# Patient Record
Sex: Female | Born: 1965 | Race: White | Hispanic: No | Marital: Single | Smoking: Current every day smoker
Health system: Southern US, Community
[De-identification: ages and names within clinical notes are randomized; demographics above are authoritative.]

## PROBLEM LIST (undated history)

## (undated) DIAGNOSIS — E785 Hyperlipidemia, unspecified: Secondary | ICD-10-CM

## (undated) DIAGNOSIS — E119 Type 2 diabetes mellitus without complications: Secondary | ICD-10-CM

## (undated) DIAGNOSIS — I10 Essential (primary) hypertension: Secondary | ICD-10-CM

## (undated) DIAGNOSIS — J449 Chronic obstructive pulmonary disease, unspecified: Secondary | ICD-10-CM

## (undated) HISTORY — PX: CHOLECYSTECTOMY: SHX55

## (undated) HISTORY — PX: APPENDECTOMY: SHX54

## (undated) HISTORY — PX: HAND SURGERY: SHX662

## (undated) HISTORY — PX: NECK SURGERY: SHX720

## (undated) HISTORY — PX: FOOT SURGERY: SHX648

## (undated) HISTORY — PX: TONSILLECTOMY: SUR1361

## (undated) HISTORY — PX: KNEE SURGERY: SHX244

## (undated) HISTORY — PX: INGUINAL HERNIA REPAIR: SHX194

---

## 2012-11-08 ENCOUNTER — Emergency Department: Payer: Self-pay | Admitting: Emergency Medicine

## 2013-01-30 ENCOUNTER — Emergency Department: Payer: Self-pay | Admitting: Emergency Medicine

## 2013-01-30 LAB — URINALYSIS, COMPLETE
Bilirubin,UR: NEGATIVE
Blood: NEGATIVE
Glucose,UR: NEGATIVE mg/dL (ref 0–75)
Ketone: NEGATIVE
Nitrite: NEGATIVE
Ph: 5 (ref 4.5–8.0)
Specific Gravity: 1.019 (ref 1.003–1.030)
Squamous Epithelial: 30
WBC UR: 1 /HPF (ref 0–5)

## 2013-01-30 LAB — COMPREHENSIVE METABOLIC PANEL
Albumin: 3.9 g/dL (ref 3.4–5.0)
Alkaline Phosphatase: 93 U/L (ref 50–136)
Anion Gap: 8 (ref 7–16)
Calcium, Total: 8.8 mg/dL (ref 8.5–10.1)
Chloride: 106 mmol/L (ref 98–107)
Co2: 27 mmol/L (ref 21–32)
Creatinine: 0.81 mg/dL (ref 0.60–1.30)
EGFR (African American): >60
EGFR (Non-African Amer.): >60
Glucose: 97 mg/dL (ref 65–99)
Osmolality: 280 (ref 275–301)
SGOT(AST): 33 U/L (ref 15–37)

## 2013-01-30 LAB — CBC
HCT: 43.5 % (ref 35.0–47.0)
HGB: 14.8 g/dL (ref 12.0–16.0)
MCH: 31.4 pg (ref 26.0–34.0)
RBC: 4.71 10*6/uL (ref 3.80–5.20)
RDW: 12.8 % (ref 11.5–14.5)

## 2013-01-30 LAB — RAPID INFLUENZA A&B ANTIGENS

## 2013-02-12 ENCOUNTER — Emergency Department: Payer: Self-pay | Admitting: Emergency Medicine

## 2013-03-23 ENCOUNTER — Ambulatory Visit: Payer: Self-pay | Admitting: Family Medicine

## 2013-03-29 ENCOUNTER — Emergency Department: Payer: Self-pay

## 2013-03-29 LAB — COMPREHENSIVE METABOLIC PANEL WITH GFR
Albumin: 3.9 g/dL
Alkaline Phosphatase: 94 U/L
Anion Gap: 5 — ABNORMAL LOW
BUN: 16 mg/dL
Bilirubin,Total: 0.3 mg/dL
Calcium, Total: 8.8 mg/dL
Chloride: 110 mmol/L — ABNORMAL HIGH
Co2: 26 mmol/L
Creatinine: 0.68 mg/dL
EGFR (African American): >60
EGFR (Non-African Amer.): >60
Glucose: 115 mg/dL — ABNORMAL HIGH
Osmolality: 283
Potassium: 3.7 mmol/L
SGOT(AST): 26 U/L
SGPT (ALT): 30 U/L
Sodium: 141 mmol/L
Total Protein: 7.3 g/dL

## 2013-03-29 LAB — CBC
HCT: 40.9 % (ref 35.0–47.0)
MCH: 31.3 pg (ref 26.0–34.0)
MCV: 91 fL (ref 80–100)
Platelet: 276 10*3/uL (ref 150–440)
RBC: 4.51 10*6/uL (ref 3.80–5.20)
RDW: 13.4 % (ref 11.5–14.5)
WBC: 7.4 10*3/uL (ref 3.6–11.0)

## 2013-03-29 LAB — URINALYSIS, COMPLETE
Bacteria: NONE SEEN
Bilirubin,UR: NEGATIVE
Blood: NEGATIVE
Glucose,UR: NEGATIVE mg/dL
Ketone: NEGATIVE
Leukocyte Esterase: NEGATIVE
Nitrite: NEGATIVE
Ph: 5
Protein: NEGATIVE
RBC,UR: 1 /HPF
Specific Gravity: 1.02
Squamous Epithelial: 2
WBC UR: 2 /HPF

## 2013-03-29 LAB — CK TOTAL AND CKMB (NOT AT ARMC): CK-MB: 0.9 ng/mL (ref 0.5–3.6)

## 2013-03-29 LAB — TROPONIN I: Troponin-I: <0.02 ng/mL

## 2014-09-19 ENCOUNTER — Emergency Department (HOSPITAL_COMMUNITY)
Admission: EM | Admit: 2014-09-19 | Discharge: 2014-09-19 | Disposition: A | Discharge status: Home / Self Care | Payer: Medicaid Other | Attending: Emergency Medicine | Admitting: Emergency Medicine

## 2014-09-19 ENCOUNTER — Emergency Department (HOSPITAL_COMMUNITY): Payer: Medicaid Other

## 2014-09-19 ENCOUNTER — Encounter (HOSPITAL_COMMUNITY): Payer: Self-pay | Admitting: Emergency Medicine

## 2014-09-19 DIAGNOSIS — Z720 Tobacco use: Secondary | ICD-10-CM | POA: Insufficient documentation

## 2014-09-19 DIAGNOSIS — Y9389 Activity, other specified: Secondary | ICD-10-CM | POA: Insufficient documentation

## 2014-09-19 DIAGNOSIS — W1830XA Fall on same level, unspecified, initial encounter: Secondary | ICD-10-CM | POA: Diagnosis not present

## 2014-09-19 DIAGNOSIS — Y9289 Other specified places as the place of occurrence of the external cause: Secondary | ICD-10-CM | POA: Insufficient documentation

## 2014-09-19 DIAGNOSIS — E119 Type 2 diabetes mellitus without complications: Secondary | ICD-10-CM | POA: Insufficient documentation

## 2014-09-19 DIAGNOSIS — I10 Essential (primary) hypertension: Secondary | ICD-10-CM | POA: Insufficient documentation

## 2014-09-19 DIAGNOSIS — M5442 Lumbago with sciatica, left side: Secondary | ICD-10-CM

## 2014-09-19 DIAGNOSIS — S300XXA Contusion of lower back and pelvis, initial encounter: Secondary | ICD-10-CM | POA: Insufficient documentation

## 2014-09-19 DIAGNOSIS — S3992XA Unspecified injury of lower back, initial encounter: Secondary | ICD-10-CM | POA: Insufficient documentation

## 2014-09-19 DIAGNOSIS — J449 Chronic obstructive pulmonary disease, unspecified: Secondary | ICD-10-CM | POA: Insufficient documentation

## 2014-09-19 HISTORY — DX: Chronic obstructive pulmonary disease, unspecified: J44.9

## 2014-09-19 HISTORY — DX: Hyperlipidemia, unspecified: E78.5

## 2014-09-19 HISTORY — DX: Essential (primary) hypertension: I10

## 2014-09-19 HISTORY — DX: Type 2 diabetes mellitus without complications: E11.9

## 2014-09-19 MED ORDER — OXYCODONE-ACETAMINOPHEN 5-325 MG PO TABS: 1.0000 | ORAL_TABLET | Freq: Four times a day (QID) | ORAL | Status: DC | PRN

## 2014-09-19 MED ORDER — OXYCODONE-ACETAMINOPHEN 5-325 MG PO TABS
2.0000 | ORAL_TABLET | Freq: Once | ORAL | Status: AC
  Administered 2014-09-19: 2 via ORAL
  Filled 2014-09-19: qty 2

## 2014-09-19 MED ORDER — ONDANSETRON 4 MG PO TBDP
4.0000 mg | ORAL_TABLET | Freq: Once | ORAL | Status: AC
  Administered 2014-09-19: 4 mg via ORAL
  Filled 2014-09-19: qty 1

## 2014-09-19 MED ORDER — HYDROCODONE-ACETAMINOPHEN 5-325 MG PO TABS
2.0000 | ORAL_TABLET | Freq: Once | ORAL | Status: DC
  Filled 2014-09-19: qty 2

## 2014-09-19 MED ORDER — CYCLOBENZAPRINE HCL 10 MG PO TABS: 10.0000 mg | ORAL_TABLET | Freq: Two times a day (BID) | ORAL | Status: DC | PRN

## 2014-09-19 NOTE — Discharge Instructions (Signed)
Sciatica °Sciatica is pain, weakness, numbness, or tingling along the path of the sciatic nerve. The nerve starts in the lower back and runs down the back of each leg. The nerve controls the muscles in the lower leg and in the back of the knee, while also providing sensation to the back of the thigh, lower leg, and the sole of your foot. Sciatica is a symptom of another medical condition. For instance, nerve damage or certain conditions, such as a herniated disk or bone spur on the spine, pinch or put pressure on the sciatic nerve. This causes the pain, weakness, or other sensations normally associated with sciatica. Generally, sciatica only affects one side of the body. °CAUSES  °· Herniated or slipped disc. °· Degenerative disk disease. °· A pain disorder involving the narrow muscle in the buttocks (piriformis syndrome). °· Pelvic injury or fracture. °· Pregnancy. °· Tumor (rare). °SYMPTOMS  °Symptoms can vary from mild to very severe. The symptoms usually travel from the low back to the buttocks and down the back of the leg. Symptoms can include: °· Mild tingling or dull aches in the lower back, leg, or hip. °· Numbness in the back of the calf or sole of the foot. °· Burning sensations in the lower back, leg, or hip. °· Sharp pains in the lower back, leg, or hip. °· Leg weakness. °· Severe back pain inhibiting movement. °These symptoms may get worse with coughing, sneezing, laughing, or prolonged sitting or standing. Also, being overweight may worsen symptoms. °DIAGNOSIS  °Your caregiver will perform a physical exam to look for common symptoms of sciatica. He or she may ask you to do certain movements or activities that would trigger sciatic nerve pain. Other tests may be performed to find the cause of the sciatica. These may include: °· Blood tests. °· X-rays. °· Imaging tests, such as an MRI or CT scan. °TREATMENT  °Treatment is directed at the cause of the sciatic pain. Sometimes, treatment is not necessary  and the pain and discomfort goes away on its own. If treatment is needed, your caregiver may suggest: °· Over-the-counter medicines to relieve pain. °· Prescription medicines, such as anti-inflammatory medicine, muscle relaxants, or narcotics. °· Applying heat or ice to the painful area. °· Steroid injections to lessen pain, irritation, and inflammation around the nerve. °· Reducing activity during periods of pain. °· Exercising and stretching to strengthen your abdomen and improve flexibility of your spine. Your caregiver may suggest losing weight if the extra weight makes the back pain worse. °· Physical therapy. °· Surgery to eliminate what is pressing or pinching the nerve, such as a bone spur or part of a herniated disk. °HOME CARE INSTRUCTIONS  °· Only take over-the-counter or prescription medicines for pain or discomfort as directed by your caregiver. °· Apply ice to the affected area for 20 minutes, 3-4 times a day for the first 48-72 hours. Then try heat in the same way. °· Exercise, stretch, or perform your usual activities if these do not aggravate your pain. °· Attend physical therapy sessions as directed by your caregiver. °· Keep all follow-up appointments as directed by your caregiver. °· Do not wear high heels or shoes that do not provide proper support. °· Check your mattress to see if it is too soft. A firm mattress may lessen your pain and discomfort. °SEEK IMMEDIATE MEDICAL CARE IF:  °· You lose control of your bowel or bladder (incontinence). °· You have increasing weakness in the lower back, pelvis, buttocks,   or legs. °· You have redness or swelling of your back. °· You have a burning sensation when you urinate. °· You have pain that gets worse when you lie down or awakens you at night. °· Your pain is worse than you have experienced in the past. °· Your pain is lasting longer than 4 weeks. °· You are suddenly losing weight without reason. °MAKE SURE YOU: °· Understand these  instructions. °· Will watch your condition. °· Will get help right away if you are not doing well or get worse. °Document Released: 11/24/2001 Document Revised: 05/31/2012 Document Reviewed: 04/10/2012 °[employer]® Patient Information ©2015 [employer]. This information is not intended to replace advice given to you by your health care provider. Make sure you discuss any questions you have with your health care provider. ° °Back Pain, Adult °Back pain is very common. The pain often gets better over time. The cause of back pain is usually not dangerous. Most people can learn to manage their back pain on their own.  °HOME CARE  °· Stay active. Start with short walks on flat ground if you can. Try to walk farther each day. °· Do not sit, drive, or stand in one place for more than 30 minutes. Do not stay in bed. °· Do not avoid exercise or work. Activity can help your back heal faster. °· Be careful when you bend or lift an object. Bend at your knees, keep the object close to you, and do not twist. °· Sleep on a firm mattress. Lie on your side, and bend your knees. If you lie on your back, put a pillow under your knees. °· Only take medicines as told by your doctor. °· Put ice on the injured area. °¨ Put ice in a plastic bag. °¨ Place a towel between your skin and the bag. °¨ Leave the ice on for 15-20 minutes, 03-04 times a day for the first 2 to 3 days. After that, you can switch between ice and heat packs. °· Ask your doctor about back exercises or massage. °· Avoid feeling anxious or stressed. Find good ways to deal with stress, such as exercise. °GET HELP RIGHT AWAY IF:  °· Your pain does not go away with rest or medicine. °· Your pain does not go away in 1 week. °· You have new problems. °· You do not feel well. °· The pain spreads into your legs. °· You cannot control when you poop (bowel movement) or pee (urinate). °· Your arms or legs feel weak or lose feeling (numbness). °· You feel sick to your stomach  (nauseous) or throw up (vomit). °· You have belly (abdominal) pain. °· You feel like you may pass out (faint). °MAKE SURE YOU:  °· Understand these instructions. °· Will watch your condition. °· Will get help right away if you are not doing well or get worse. °Document Released: 05/18/2008 Document Revised: 02/22/2012 Document Reviewed: 04/03/2014 °[employer]® Patient Information ©2015 [employer]. This information is not intended to replace advice given to you by your health care provider. Make sure you discuss any questions you have with your health care provider. ° °Back Exercises °Back exercises help treat and prevent back injuries. The goal is to increase your strength in your belly (abdominal) and back muscles. These exercises can also help with flexibility. Start these exercises when told by your doctor. °HOME CARE °Back exercises include: °Pelvic Tilt. °· Lie on your back with your knees bent. Tilt your pelvis until the lower part of   your back is against the floor. Hold this position 5 to 10 sec. Repeat this exercise 5 to 10 times. °Knee to Chest. °· Pull 1 knee up against your chest and hold for 20 to 30 seconds. Repeat this with the other knee. This may be done with the other leg straight or bent, whichever feels better. Then, pull both knees up against your chest. °Sit-Ups or Curl-Ups. °· Bend your knees 90 degrees. Start with tilting your pelvis, and do a partial, slow sit-up. Only lift your upper half 30 to 45 degrees off the floor. Take at least 2 to 3 seonds for each sit-up. Do not do sit-ups with your knees out straight. If partial sit-ups are difficult, simply do the above but with only tightening your belly (abdominal) muscles and holding it as told. °Hip-Lift. °· Lie on your back with your knees flexed 90 degrees. Push down with your feet and shoulders as you raise your hips 2 inches off the floor. Hold for 10 seconds, repeat 5 to 10 times. °Back Arches. °· Lie on your stomach. Prop yourself up on  bent elbows. Slowly press on your hands, causing an arch in your low back. Repeat 3 to 5 times. °Shoulder-Lifts. °· Lie face down with arms beside your body. Keep hips and belly pressed to floor as you slowly lift your head and shoulders off the floor. °Do not overdo your exercises. Be careful in the beginning. Exercises may cause you some mild back discomfort. If the pain lasts for more than 15 minutes, stop the exercises until you see your doctor. Improvement with exercise for back problems is slow.  °Document Released: 01/02/2011 Document Revised: 02/22/2012 Document Reviewed: 10/01/2011 °ExitCare® Patient Information ©2015 [employer]. This information is not intended to replace advice given to you by your health care provider. Make sure you discuss any questions you have with your health care provider. ° °

## 2014-09-19 NOTE — ED Notes (Signed)
Pt reports she fell back into a heater 2 weeks ago injuring L side of back. Pt slept on air mattress last night and reports increased pain.no loss of bowel or urine.

## 2014-09-19 NOTE — ED Provider Notes (Signed)
CSN: [id number]     Arrival date & time 09/19/14  4098 History  This chart was scribed for non-physician practitioner, Ladona Mow, PA-C working with Toy Cookey, MD by Greggory Stallion, ED scribe. This patient was seen in room TR07C/TR07C and the patient's care was started at 7:58 PM.   Chief Complaint  Patient presents with  . Back Pain   The history is provided by the patient. No language interpreter was used.   HPI Comments: Connie Hernandez is a 48 y.o. female who presents to the Emergency Department complaining of left lower back pain that started 2 weeks ago after falling back onto a heater. States pain worsened last night while sleeping. Pain radiates into her left buttock, groin and thigh. Movements worsen the pain. She has used icyhot with no relief. Denies fever, bowel or bladder incontinence, saddle anesthesia. Denies personal history of cancer or IV drug use.   Past Medical History  Diagnosis Date  . COPD (chronic obstructive pulmonary disease)   . Hypertension   . Hyperlipemia   . Diabetes mellitus without complication    Past Surgical History  Procedure Laterality Date  . Neck surgery    . Hand surgery    . Cholecystectomy    . Appendectomy    . Tonsillectomy    . Inguinal hernia repair    . Foot surgery    . Knee surgery     No family history on file. History  Substance Use Topics  . Smoking status: Current Every Day Smoker  . Smokeless tobacco: Not on file  . Alcohol Use: No   OB History   Grav Para Term Preterm Abortions TAB SAB Ect Mult Living                 Review of Systems  Constitutional: Negative for fever.  Genitourinary:       Negative for bowel or bladder incontinence.  Musculoskeletal: Positive for back pain and myalgias.  All other systems reviewed and are negative.  Allergies  Dilaudid; Vicodin; and Wellbutrin  Home Medications   Prior to Admission medications   Medication Sig Start Date End Date Taking? Authorizing Provider   cyclobenzaprine (FLEXERIL) 10 MG tablet Take 1 tablet (10 mg total) by mouth 2 (two) times daily as needed for muscle spasms. 09/19/14   Monte Fantasia, PA-C  oxyCODONE-acetaminophen (PERCOCET) 5-325 MG per tablet Take 1 tablet by mouth every 6 (six) hours as needed. 09/19/14   Monte Fantasia, PA-C   BP 123/78  Pulse 73  Temp(Src) 98.3 F (36.8 C) (Oral)  Resp 18  Ht 5\' 1"  (1.549 m)  Wt 205 lb (92.987 kg)  BMI 38.75 kg/m2  SpO2 97%  Physical Exam  Nursing note and vitals reviewed. Constitutional: She is oriented to person, place, and time. She appears well-developed and well-nourished. No distress.  HENT:  Head: Normocephalic and atraumatic.  Eyes: Conjunctivae and EOM are normal.  Neck: Neck supple. No tracheal deviation present.  Cardiovascular: Normal rate.   Pulmonary/Chest: Effort normal. No respiratory distress.  Musculoskeletal: Normal range of motion.  Contusion on left upper gluteal region. Tenderness diffusely throughout L-spine and left SI joint. Positive straight leg raise test on the left.   Neurological: She is alert and oriented to person, place, and time.  Skin: Skin is warm and dry.  Psychiatric: She has a normal mood and affect. Her behavior is normal.    ED Course  Procedures (including critical care time)  DIAGNOSTIC STUDIES: Oxygen Saturation is  98% on RA, normal by my interpretation.    COORDINATION OF CARE: 8:05 PM-Discussed treatment plan which includes xray, pain medication, an anti-inflammatory and a muscle relaxer with pt at bedside and pt agreed to plan.   Labs Review Labs Reviewed - No data to display  Imaging Review Dg Lumbar Spine Complete  09/19/2014   CLINICAL DATA:  Initial encounter for fall 2 weeks at home. The patient struck and cast iron heater with her left buttock and hit the floor with her back. Progressive pain. Pain extends from the left buttock into the left upper lumbar region. Pain also extends into the left groin.  EXAM: LUMBAR  SPINE - COMPLETE 4+ VIEW  COMPARISON:  None.  FINDINGS: Only 4 non rib-bearing lumbar type vertebral bodies are present. Vertebral body heights and alignment are maintained. No acute abnormality is present. Minimal vascular calcifications are noted at the aortic bifurcation. Surgical clips are present within the gallbladder fossa.  IMPRESSION: 1. No acute abnormality. 2. 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 3. Minimal atherosclerosis.   Electronically Signed   By: Gennette Pachris  Mattern M.D.   On: 09/19/2014 22:07     EKG Interpretation None      MDM   Final diagnoses:  Midline low back pain with left-sided sciatica   Patient with back pain.  No neurological deficits and normal neuro exam.  Patient can walk but states is painful.  No loss of bowel or bladder control.  No concern for cauda equina.  No fever, night sweats, weight loss, h/o cancer, IVDU.  RICE protocol and pain medicine indicated and discussed with patient. Patient's [place] and she will followup with them when she returns back home. I encouraged patient to call or return to the ER should her symptoms persist, change, worsen or should she have a questions or concerns  BP 123/78  Pulse 73  Temp(Src) 98.3 F (36.8 C) (Oral)  Resp 18  Ht 5\' 1"  (1.549 m)  Wt 205 lb (92.987 kg)  BMI 38.75 kg/m2  SpO2 97%  Signed,  Ladona MowJoe Mackenna Kamer, PA-C 2:24 AM  I personally performed the services described in this documentation, which was scribed in my presence. The recorded information has been reviewed and is accurate.  Monte FantasiaJoseph W Skylan Gift, PA-C 09/20/14 [phone]

## 2014-09-20 NOTE — ED Provider Notes (Signed)
Medical screening examination/treatment/procedure(s) were performed by non-physician practitioner and as supervising physician I was immediately available for consultation/collaboration.  Megan Docherty, MD 09/20/14 0926 

## 2019-01-17 ENCOUNTER — Other Ambulatory Visit: Payer: Self-pay | Admitting: Family Medicine

## 2019-01-17 DIAGNOSIS — R928 Other abnormal and inconclusive findings on diagnostic imaging of breast: Secondary | ICD-10-CM

## 2019-02-22 ENCOUNTER — Encounter: Payer: Self-pay | Admitting: Emergency Medicine

## 2019-02-22 ENCOUNTER — Emergency Department: Payer: Medicaid Other

## 2019-02-22 ENCOUNTER — Other Ambulatory Visit: Payer: Self-pay

## 2019-02-22 ENCOUNTER — Emergency Department
Admission: EM | Admit: 2019-02-22 | Discharge: 2019-02-22 | Disposition: A | Discharge status: Home / Self Care | Payer: Medicaid Other | Attending: Emergency Medicine | Admitting: Emergency Medicine

## 2019-02-22 DIAGNOSIS — Z79899 Other long term (current) drug therapy: Secondary | ICD-10-CM | POA: Diagnosis not present

## 2019-02-22 DIAGNOSIS — I10 Essential (primary) hypertension: Secondary | ICD-10-CM | POA: Diagnosis not present

## 2019-02-22 DIAGNOSIS — M65271 Calcific tendinitis, right ankle and foot: Secondary | ICD-10-CM | POA: Insufficient documentation

## 2019-02-22 DIAGNOSIS — M79604 Pain in right leg: Secondary | ICD-10-CM | POA: Diagnosis present

## 2019-02-22 DIAGNOSIS — E119 Type 2 diabetes mellitus without complications: Secondary | ICD-10-CM | POA: Diagnosis not present

## 2019-02-22 DIAGNOSIS — F17210 Nicotine dependence, cigarettes, uncomplicated: Secondary | ICD-10-CM | POA: Insufficient documentation

## 2019-02-22 DIAGNOSIS — J449 Chronic obstructive pulmonary disease, unspecified: Secondary | ICD-10-CM | POA: Insufficient documentation

## 2019-02-22 DIAGNOSIS — M25551 Pain in right hip: Secondary | ICD-10-CM | POA: Insufficient documentation

## 2019-02-22 MED ORDER — MELOXICAM 15 MG PO TABS: 15.0000 mg | ORAL_TABLET | Freq: Every day | ORAL | 0 refills | Status: DC

## 2019-02-22 NOTE — ED Notes (Signed)

## 2019-02-22 NOTE — ED Triage Notes (Signed)
Patient complaining of right leg pain "off and on for a couple of months", worsening recently, keeping her up at night.  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  Patient states pain is in her foot radiating up her leg.

## 2019-02-22 NOTE — ED Provider Notes (Signed)
[facility] Emergency Department Provider Note   ____________________________________________   None    (approximate)  I have reviewed the triage vital signs and the nursing notes.   HISTORY  Chief Complaint Leg Pain    HPI Connie Hernandez is a 53 y.o. female patient complain of right hip pain for couple of months.  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  Patient believes she has a history of necrosis in both hips which she [place].  Patient also complain of right plantar foot pain it radiates up her leg.  Patient state pain increases with nonweightbearing.  Patient has been told that she has diabetic neuropathy.  Patient rates the pain as a 7/10.  Patient described the pain as "achy".  No palliative measure for complaint.         Past Medical History:  Diagnosis Date  . COPD (chronic obstructive pulmonary disease) (HCC)   . Diabetes mellitus without complication (HCC)   . Hyperlipemia   . Hypertension     There are no active problems to display for this patient.   Past Surgical History:  Procedure Laterality Date  . APPENDECTOMY    . CHOLECYSTECTOMY    . FOOT SURGERY    . HAND SURGERY    . INGUINAL HERNIA REPAIR    . KNEE SURGERY    . NECK SURGERY    . TONSILLECTOMY      Prior to Admission medications   Medication Sig Start Date End Date Taking? Authorizing Provider  cyclobenzaprine (FLEXERIL) 10 MG tablet Take 1 tablet (10 mg total) by mouth 2 (two) times daily as needed for muscle spasms. 09/19/14   Ladona Mow, PA-C  meloxicam (MOBIC) 15 MG tablet Take 1 tablet (15 mg total) by mouth daily. 02/22/19   Joni Reining, PA-C  oxyCODONE-acetaminophen (PERCOCET) 5-325 MG per tablet Take 1 tablet by mouth every 6 (six) hours as needed. 09/19/14   Ladona Mow, PA-C    Allergies Dilaudid [hydromorphone hcl]; Vicodin [hydrocodone-acetaminophen]; and Wellbutrin [bupropion]  No family history on  file.  Social History Social History   Tobacco Use  . Smoking status: Current Every Day Smoker    Packs/day: 0.50    Types: Cigarettes  . Smokeless tobacco: Never Used  Substance Use Topics  . Alcohol use: No  . Drug use: No    Review of Systems Constitutional: No fever/chills Eyes: No visual changes. ENT: No sore throat. Cardiovascular: Denies chest pain. Respiratory: Denies shortness of breath. Gastrointestinal: No abdominal pain.  No nausea, no vomiting.  No diarrhea.  No constipation. Genitourinary: Negative for dysuria. Musculoskeletal: Negative for back pain. Skin: Negative for rash. Neurological: Negative for headaches, focal weakness or numbness. Endocrine:  Diabetes, hyperlipidemia, hypertension. Allergic/Immunilogical: Dilaudid, Vicodin, and Wellbutrin. ____________________________________________   PHYSICAL EXAM:  VITAL SIGNS: ED Triage Vitals  Enc Vitals Group     BP 02/22/19 0954 (!) 156/94     Pulse Rate 02/22/19 0954 93     Resp 02/22/19 0954 16     Temp 02/22/19 0954 98.1 F (36.7 C)     Temp Source 02/22/19 0954 Oral     SpO2 02/22/19 0954 96 %     Weight 02/22/19 0949 172 lb (78 kg)     Height 02/22/19 0949  (1.549 m)     Head Circumference --      Peak Flow --      Pain Score 02/22/19 0949 7  Pain Loc --      Pain Edu? --      Excl. in GC? --     Constitutional: Alert and oriented. Well appearing and in no acute distress. Cardiovascular: Normal rate, regular rhythm. Grossly normal heart sounds.  Good peripheral circulation.  Elevated blood pressure. Respiratory: Normal respiratory effort.  No retractions. Lungs CTAB. Gastrointestinal: Soft and nontender. No distention. No abdominal bruits. No CVA tenderness. Musculoskeletal: No obvious hip deformity.  No leg length discrepancy.  Patient is moderate guarding palpation of the greater trochanter of the right hip.  Examination of foot shows a high arch.  No other obvious deformity.  No  guarding with palpation.  Full and equal range of motion.   Neurologic:  Normal speech and language. No gross focal neurologic deficits are appreciated. No gait instability. Skin:  Skin is warm, dry and intact. No rash noted. Psychiatric: Mood and affect are normal. Speech and behavior are normal.  ____________________________________________   LABS (all labs ordered are listed, but only abnormal results are displayed)  Labs Reviewed - No data to display ____________________________________________  EKG   ____________________________________________  RADIOLOGY  ED MD interpretation:    Official radiology report(s): Dg Foot 2 Views Right  Result Date: 02/22/2019 CLINICAL DATA:  Right foot pain without injury. EXAM: RIGHT FOOT - 2 VIEW COMPARISON:  None. FINDINGS: No evidence of fracture or dislocation. Mild hallux valgus deformity of the MTP joint of the great toe. Calcification projecting over the arch on the lateral view probably relates to chronic tendon calcification. IMPRESSION: No acute bone or joint finding. Mild hallux valgus deformity of the great toe. Calcification projecting over the arch on the lateral view probably relates to chronic tendon calcification. Electronically Signed   By: Paulina Fusi M.D.   On: 02/22/2019 11:33   Dg Hip Unilat W Or Wo Pelvis 2-3 Views Right  Result Date: 02/22/2019 CLINICAL DATA:  Right hip and foot pain over the last several weeks. EXAM: DG HIP (WITH OR WITHOUT PELVIS) 2-3V RIGHT COMPARISON:  Lumbar radiographs 09/19/2014 FINDINGS: Right hip does not show joint space narrowing. There are small inferior acetabular osteophytes. No sign of avascular necrosis on this side. Sacroiliac joints and symphysis pubis appear normal. There are 3 it changes of the left hip with joint space narrowing, inferior acetabular osteophytes and cystic change in the femoral head and acetabulum. IMPRESSION: No acute right hip finding.  Small inferior acetabular  osteophytes. 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 Electronically Signed   By: Paulina Fusi M.D.   On: 02/22/2019 11:31    ____________________________________________   PROCEDURES  Procedure(s) performed (including Critical Care):  Procedures   ____________________________________________   INITIAL IMPRESSION / ASSESSMENT AND PLAN / ED COURSE  As part of my medical decision making, I reviewed the following data within the electronic MEDICAL RECORD NUMBER  Patient complain of chronic right hip pain in 1 week of right foot pain.  Discussed x-ray findings with patient showing degenerative changes in the hip and a calcified tendon lesion i at midfoot.  Patient given discharge instructions and advised follow orthopedic for definitive evaluation and treatment.         ____________________________________________   FINAL CLINICAL IMPRESSION(S) / ED DIAGNOSES  Final diagnoses:  Pain in joint of right hip  Calcific tendinitis of right foot     ED Discharge Orders         Ordered    meloxicam (MOBIC) 15 MG  tablet  Daily     02/22/19 1147           Note:  This document was prepared using Dragon voice recognition software and may include unintentional dictation errors.    Joni Reining, PA-C 02/22/19 1150    Phineas Semen, MD 02/22/19 1212

## 2019-02-22 NOTE — Discharge Instructions (Signed)
Take medication as directed follow-up orthopedic for definitive evaluation and treatment.

## 2019-02-22 NOTE — ED Notes (Signed)
See triage note: Pt c/o pain that starts in the middle of her right foot and travels up to her hips. Pain has been there for a while but has gotten significantly worse over the past few days.

## 2019-04-20 ENCOUNTER — Other Ambulatory Visit: Payer: Self-pay

## 2019-04-20 ENCOUNTER — Emergency Department: Payer: Medicaid Other

## 2019-04-20 ENCOUNTER — Emergency Department
Admission: EM | Admit: 2019-04-20 | Discharge: 2019-04-20 | Disposition: A | Discharge status: Home / Self Care | Payer: Medicaid Other | Attending: Student in an Organized Health Care Education/Training Program | Admitting: Student in an Organized Health Care Education/Training Program

## 2019-04-20 ENCOUNTER — Encounter: Payer: Self-pay | Admitting: Emergency Medicine

## 2019-04-20 DIAGNOSIS — R05 Cough: Secondary | ICD-10-CM

## 2019-04-20 DIAGNOSIS — J309 Allergic rhinitis, unspecified: Secondary | ICD-10-CM | POA: Insufficient documentation

## 2019-04-20 DIAGNOSIS — R059 Cough, unspecified: Secondary | ICD-10-CM

## 2019-04-20 DIAGNOSIS — Z79899 Other long term (current) drug therapy: Secondary | ICD-10-CM | POA: Insufficient documentation

## 2019-04-20 DIAGNOSIS — J449 Chronic obstructive pulmonary disease, unspecified: Secondary | ICD-10-CM | POA: Insufficient documentation

## 2019-04-20 DIAGNOSIS — E119 Type 2 diabetes mellitus without complications: Secondary | ICD-10-CM | POA: Diagnosis not present

## 2019-04-20 DIAGNOSIS — I10 Essential (primary) hypertension: Secondary | ICD-10-CM | POA: Insufficient documentation

## 2019-04-20 DIAGNOSIS — F17210 Nicotine dependence, cigarettes, uncomplicated: Secondary | ICD-10-CM | POA: Insufficient documentation

## 2019-04-20 LAB — CBC
HCT: 42.3 % (ref 36.0–46.0)
Hemoglobin: 14.3 g/dL (ref 12.0–15.0)
MCH: 30.6 pg (ref 26.0–34.0)
MCHC: 33.8 g/dL (ref 30.0–36.0)
MCV: 90.4 fL (ref 80.0–100.0)
Platelets: 267 10*3/uL (ref 150–400)
RBC: 4.68 MIL/uL (ref 3.87–5.11)
RDW: 12.4 % (ref 11.5–15.5)
WBC: 6.7 10*3/uL (ref 4.0–10.5)
nRBC: 0 % (ref 0.0–0.2)

## 2019-04-20 LAB — BASIC METABOLIC PANEL
Anion gap: 10 (ref 5–15)
BUN: 20 mg/dL (ref 6–20)
CO2: 26 mmol/L (ref 22–32)
Calcium: 9.3 mg/dL (ref 8.9–10.3)
Chloride: 106 mmol/L (ref 98–111)
Creatinine, Ser: 0.71 mg/dL (ref 0.44–1.00)
GFR calc Af Amer: >60 mL/min (ref >60)
GFR calc non Af Amer: >60 mL/min (ref >60)
Glucose, Bld: 113 mg/dL — ABNORMAL HIGH (ref 70–99)
Potassium: 4.1 mmol/L (ref 3.5–5.1)
Sodium: 142 mmol/L (ref 135–145)

## 2019-04-20 LAB — URINALYSIS, COMPLETE (UACMP) WITH MICROSCOPIC
Bilirubin Urine: NEGATIVE
Glucose, UA: NEGATIVE mg/dL
Hgb urine dipstick: NEGATIVE
Ketones, ur: NEGATIVE mg/dL
Leukocytes,Ua: NEGATIVE
Nitrite: NEGATIVE
Protein, ur: NEGATIVE mg/dL
Specific Gravity, Urine: 1.008 (ref 1.005–1.030)
WBC, UA: NONE SEEN WBC/hpf (ref 0–5)
pH: 6 (ref 5.0–8.0)

## 2019-04-20 NOTE — ED Provider Notes (Signed)
[facility] Emergency Department Provider Note ____________________________________________  Time seen: 1332  I have reviewed the triage vital signs and the nursing notes.  HISTORY  Chief Complaint  Hypertension; Hyperglycemia; and Cough  HPI Connie Hernandez is a 53 y.o. female presents with self to the ED for evaluation of intermittent hyperglycemia and nonproductive cough.  Patient has been under the care of her primary care provider for the last month or so for similar symptoms.  She reports been evaluated by her primary care provider, and being treated with Singulair, Claritin, Tessalon Perles, and a 14-day course of doxycycline.  When she called report continued cough and congestion, the PCP suggested that she report to a local drive-through COVID testing center.  Patient was evaluated, was deemed not holdable for COVID testing secondary to her symptoms.  She was placed on a 5-day Levaquin course at that time.  Patient at this point has completed both medications.  She continues report a dry, nonproductive cough, watery eyes, and runny nose.  She denies any interim fevers, chills, sweats patient also denies any cough induced vomiting, chest pain, or shortness of breath.  Patient is recently had her blood pressure medicine changed from lisinopril 10 mg twice daily, to 20 mg daily.  She continues to report elevated blood pressure readings since that medication change in the last week. She has also noted BS readings ranging from 75-160s mg/dl.  She is on dietary control for her prediabetes.  She denies any recent travel, sick contacts, or high risk exposures at this time.  Past Medical History:  Diagnosis Date  . COPD (chronic obstructive pulmonary disease) (HCC)   . Diabetes mellitus without complication (HCC)   . Hyperlipemia   . Hypertension     There are no active problems to display for this patient.   Past Surgical History:  Procedure Laterality Date  .  APPENDECTOMY    . CHOLECYSTECTOMY    . FOOT SURGERY    . HAND SURGERY    . INGUINAL HERNIA REPAIR    . KNEE SURGERY    . NECK SURGERY    . TONSILLECTOMY      Prior to Admission medications   Medication Sig Start Date End Date Taking? Authorizing Provider  cyclobenzaprine (FLEXERIL) 10 MG tablet Take 1 tablet (10 mg total) by mouth 2 (two) times daily as needed for muscle spasms. 09/19/14   Ladona MowMintz, Joe, PA-C  meloxicam (MOBIC) 15 MG tablet Take 1 tablet (15 mg total) by mouth daily. 02/22/19   Joni ReiningSmith, Ronald K, PA-C  oxyCODONE-acetaminophen (PERCOCET) 5-325 MG per tablet Take 1 tablet by mouth every 6 (six) hours as needed. 09/19/14   Ladona MowMintz, Joe, PA-C    Allergies Dilaudid [hydromorphone hcl]; Vicodin [hydrocodone-acetaminophen]; and Wellbutrin [bupropion]  No family history on file.  Social History Social History   Tobacco Use  . Smoking status: Current Every Day Smoker    Packs/day: 0.50    Types: Cigarettes  . Smokeless tobacco: Never Used  Substance Use Topics  . Alcohol use: No  . Drug use: No    Review of Systems  Constitutional: Negative for fever. Eyes: Negative for visual changes. Reports watery eyes ENT: Negative for sore throat. Reports runny nose Cardiovascular: Negative for chest pain. Respiratory: Negative for shortness of breath. Reports non-productive cough. Gastrointestinal: Negative for abdominal pain, vomiting and diarrhea. Genitourinary: Negative for dysuria. Musculoskeletal: Negative for back pain. Skin: Negative for rash. Neurological: Negative for headaches, focal weakness or numbness. ____________________________________________  PHYSICAL EXAM:  VITAL SIGNS: ED Triage Vitals  Enc Vitals Group     BP 04/20/19 1236 (!) 160/95     Pulse Rate 04/20/19 1236 84     Resp 04/20/19 1236 18     Temp 04/20/19 1236 98.7 F (37.1 C)     Temp Source 04/20/19 1236 Oral     SpO2 04/20/19 1236 98 %     Weight 04/20/19 1236 170 lb (77.1 kg)     Height  04/20/19 1236 5\' 1"  (1.549 m)     Head Circumference --      Peak Flow --      Pain Score 04/20/19 1253 0     Pain Loc --      Pain Edu? --      Excl. in GC? --     Constitutional: Alert and oriented. Well appearing and in no distress. Head: Normocephalic and atraumatic. Eyes: Conjunctivae are normal. Normal extraocular movements Ears: Canals clear. TMs intact bilaterally. Nose: No congestion/rhinorrhea/epistaxis. Mouth/Throat: Mucous membranes are moist. Cardiovascular: Normal rate, regular rhythm. Normal distal pulses. Respiratory: Normal respiratory effort. No wheezes/rales/rhonchi. Musculoskeletal: Nontender with normal range of motion in all extremities.  Neurologic:  Normal gait without ataxia. Normal speech and language. No gross focal neurologic deficits are appreciated. Skin:  Skin is warm, dry and intact. No rash noted. Psychiatric: Mood and affect are normal. Patient exhibits appropriate insight and judgment. ____________________________________________   LABS (pertinent positives/negatives) Labs Reviewed  BASIC METABOLIC PANEL - Abnormal; Notable for the following components:      Result Value   Glucose, Bld 113 (*)    All other components within normal limits  URINALYSIS, COMPLETE (UACMP) WITH MICROSCOPIC - Abnormal; Notable for the following components:   Color, Urine STRAW (*)    APPearance CLEAR (*)    Bacteria, UA RARE (*)    All other components within normal limits  CBC  CBG MONITORING, ED  ____________________________________________   RADIOLOGY  CXR  IMPRESSION: Likely chronic changes without evidence of superimposed acute cardiopulmonary disease  I, Jacen Carlini V Bacon-Kiante Ciavarella, personally viewed and evaluated these images (plain radiographs) as part of my medical decision making, as well as reviewing the written report by the  radiologist. ____________________________________________  PROCEDURES  Procedures ____________________________________________  INITIAL IMPRESSION / ASSESSMENT AND PLAN / ED COURSE  Connie Hernandez was evaluated in Emergency Department on 04/20/2019 for the symptoms described in the history of present illness. She was evaluated in the context of the global COVID-19 pandemic, which necessitated consideration that the patient might be at risk for infection with the SARS-CoV-2 virus that causes COVID-19. Institutional protocols and algorithms that pertain to the evaluation of patients at risk for COVID-19 are in a state of rapid change based on information released by regulatory bodies including the CDC and federal and state organizations. These policies and algorithms were followed during the patient's care in the ED.  Patient with ED evaluation of persistent intermittent cough, runny nose, and concerns for BP and BS readings. Her exam is reassuring and her labs and XR do not reveal any abnormalities. She is discharged with instructions to continue with her allergy meds (Singulair, Claritin, Albuterol, Advair). She will follow-up with her PCP or return as needed.  ____________________________________________  FINAL CLINICAL IMPRESSION(S) / ED DIAGNOSES  Final diagnoses:  Allergic rhinitis, unspecified seasonality, unspecified trigger  Cough      Connie Hernandez, Connie Ivory, PA-C 04/20/19 1441    Willy Eddy, MD 04/20/19 1535

## 2019-04-20 NOTE — ED Notes (Signed)
Patient states symptoms have been ongoing x 1 month.  Has taken a coarse of Doxycycline and Levoquin, no improvement.

## 2019-04-20 NOTE — Discharge Instructions (Signed)
Your exam, labs and CXR are essentially normal at this time. You have no signs of lung infection. You should continue to dose your home meds including Flonase, Singulair, Advair, and albuterol. Follow-up with your provider for ongoing symptoms. Return as needed.

## 2019-04-20 NOTE — ED Triage Notes (Signed)
Pt here with c/o hypertension and hyperglycemia over the past few days, highest her blood sugar has been is 186, is diet controlled diabetic only. Has been recently started on new bp medication on the 4th, states it's not working. Vomits when she coughs, has had a cough for 3 months now, went thru a drive thru testing spot and they didn't test for Covid because they said she didn't have the correct symptoms, was treated Doxy and another antibiotic she can't remember the name. Pt states she feels full of fluid. NAD.

## 2019-04-20 NOTE — ED Notes (Signed)
AAOx3.  Skin warm and dry.  Ambulates with easy and steady gait.  No SOB/ DOE.  NAD

## 2020-04-23 ENCOUNTER — Emergency Department
Admission: EM | Admit: 2020-04-23 | Discharge: 2020-04-23 | Disposition: A | Discharge status: Home / Self Care | Payer: Medicaid Other | Attending: Emergency Medicine | Admitting: Emergency Medicine

## 2020-04-23 ENCOUNTER — Other Ambulatory Visit: Payer: Self-pay

## 2020-04-23 DIAGNOSIS — J019 Acute sinusitis, unspecified: Secondary | ICD-10-CM | POA: Insufficient documentation

## 2020-04-23 DIAGNOSIS — E119 Type 2 diabetes mellitus without complications: Secondary | ICD-10-CM | POA: Insufficient documentation

## 2020-04-23 DIAGNOSIS — F17210 Nicotine dependence, cigarettes, uncomplicated: Secondary | ICD-10-CM | POA: Insufficient documentation

## 2020-04-23 DIAGNOSIS — J449 Chronic obstructive pulmonary disease, unspecified: Secondary | ICD-10-CM | POA: Insufficient documentation

## 2020-04-23 DIAGNOSIS — I10 Essential (primary) hypertension: Secondary | ICD-10-CM | POA: Insufficient documentation

## 2020-04-23 LAB — GLUCOSE, CAPILLARY: Glucose-Capillary: 95 mg/dL (ref 70–99)

## 2020-04-23 MED ORDER — PREDNISONE 10 MG PO TABS
ORAL_TABLET | ORAL | 0 refills | Status: DC
Start: 2020-04-23 — End: 2020-12-04

## 2020-04-23 MED ORDER — AMOXICILLIN-POT CLAVULANATE 875-125 MG PO TABS: 1.0000 | ORAL_TABLET | Freq: Two times a day (BID) | ORAL | 0 refills | Status: AC

## 2020-04-23 NOTE — Discharge Instructions (Signed)
Follow-up with your primary care provider or Dr. Jenne Campus at [facility] ENT if you continue to have problems with your sinuses.  A prescription for Augmentin and prednisone was sent to [facility].  Use your good Rx card when getting these medications.  Increase fluids.  You may also use saline nose spray which will help with the mucus in your nose.

## 2020-04-23 NOTE — ED Triage Notes (Signed)
Pt arrives POV from home for reports of nasal congestion x 1 month. Pt also reports right ear pain. Pt reports increased thirst for the past 2 days and is concerned her sugar is high, has not checked glucose level. Reports cough and SHOB due to hx COPD

## 2020-04-23 NOTE — ED Provider Notes (Signed)
[facility] Emergency Department Provider Note  ____________________________________________   First MD Initiated Contact with Patient 04/23/20 1411     (approximate)  I have reviewed the triage vital signs and the nursing notes.   HISTORY  Chief Complaint Nasal Congestion   HPI Connie Hernandez is a 54 y.o. female presents to the ED with complaint of congestion and right ear pain for the last month.  Patient states she has also been having pain in her face and also a in her teeth and gums.  Patient states that she is also concerned about her blood sugar she has had increased thirst and has not checked her blood sugar.  She also has history of COPD and has been coughing with some shortness of breath which is not unusual for her.  Patient continues to smoke.  Currently she is complaining the most about her her nasal congestion.       Past Medical History:  Diagnosis Date  . COPD (chronic obstructive pulmonary disease) ([place])   . Diabetes mellitus without complication ([place])   . Hyperlipemia   . Hypertension     There are no problems to display for this patient.   Past Surgical History:  Procedure Laterality Date  . APPENDECTOMY    . CHOLECYSTECTOMY    . FOOT SURGERY    . HAND SURGERY    . INGUINAL HERNIA REPAIR    . KNEE SURGERY    . NECK SURGERY    . TONSILLECTOMY      Prior to Admission medications   Medication Sig Start Date End Date Taking? Authorizing Provider  amoxicillin-clavulanate (AUGMENTIN) 875-125 MG tablet Take 1 tablet by mouth 2 (two) times daily for 7 days. 04/23/20 04/30/20  Johnn Hai, PA-C  predniSONE (DELTASONE) 10 MG tablet Take 6 tablets  today, on day 2 take 5 tablets, day 3 take 4 tablets, day 4 take 3 tablets, day 5 take  2 tablets and 1 tablet the last day 04/23/20   Johnn Hai, PA-C    Allergies Dilaudid [hydromorphone hcl], Vicodin [hydrocodone-acetaminophen], and Wellbutrin [bupropion]  History  reviewed. No pertinent family history.  Social History Social History   Tobacco Use  . Smoking status: Current Every Day Smoker    Packs/day: 0.50    Types: Cigarettes  . Smokeless tobacco: Never Used  Substance Use Topics  . Alcohol use: No  . Drug use: No    Review of Systems Constitutional: No fever/chills Eyes: No visual changes. ENT: Positive sore throat.  Positive nasal congestion, drainage.  Right ear pain. Cardiovascular: Denies chest pain. Respiratory: Denies shortness of breath.  Positive for cough. Gastrointestinal: No abdominal pain.  No nausea, no vomiting. Musculoskeletal: Negative for muscle aches. Skin: Negative for rash. Neurological: Negative for headaches, focal weakness or numbness. ____________________________________________   PHYSICAL EXAM:  VITAL SIGNS: ED Triage Vitals  Enc Vitals Group     BP 04/23/20 1345 (!) 150/81     Pulse Rate 04/23/20 1345 85     Resp 04/23/20 1345 20     Temp 04/23/20 1345 98.3 F (36.8 C)     Temp Source 04/23/20 1345 Oral     SpO2 04/23/20 1345 99 %     Weight 04/23/20 1347 140 lb (63.5 kg)     Height 04/23/20 1347 4\' 11"  (1.499 m)     Head Circumference --      Peak Flow --      Pain Score 04/23/20 1347 0  Pain Loc --      Pain Edu? --      Excl. in GC? --     Constitutional: Alert and oriented. Well appearing and in no acute distress. Eyes: Conjunctivae are normal. PERRL. EOMI. Head: Atraumatic. Nose: Mild nasal congestion however there is mucus noted on the right side.  Left EAC without erythema or injection however there is a fluid level/effusion present with poor light reflex.  Right EAC is clear with TM also with poor light reflex. Mouth/Throat: Mucous membranes are moist.  Oropharynx non-erythematous.  Posterior drainage noted. Neck: No stridor.   Cardiovascular: Normal rate, regular rhythm. Grossly normal heart sounds.  Good peripheral circulation. Respiratory: Normal respiratory effort.  No  retractions. Lungs CTAB. Musculoskeletal: Moves upper and lower extremities any difficulty normal gait was noted. Neurologic:  Normal speech and language. No gross focal neurologic deficits are appreciated. No gait instability. Skin:  Skin is warm, dry and intact. No rash noted. Psychiatric: Mood and affect are normal. Speech and behavior are normal.  ____________________________________________   LABS (all labs ordered are listed, but only abnormal results are displayed)  Labs Reviewed  GLUCOSE, CAPILLARY    PROCEDURES  Procedure(s) performed (including Critical Care):  Procedures   ____________________________________________   INITIAL IMPRESSION / ASSESSMENT AND PLAN / ED COURSE  As part of my medical decision making, I reviewed the following data within the electronic MEDICAL RECORD NUMBER Notes from prior ED visits and [place] Controlled Substance Database  54 year old female presents to the ED with complaint of congestion and right ear pain for 1 month.  Patient states she is also having facial pain that also makes her gums and teeth hurt.  She reports a history of sinusitis.  She is also concerned of her blood sugar as she has been thirsty for the past 2 days.  Glucose fingerstick was 95.  Physical exam is consistent with a sinusitis with mild effusion noted bilateral ears.  Patient has been on prednisone in the past for her COPD.  She was also prescribed Augmentin for the next 10 days.  If she continues to have problems she should follow-up with her PCP or Dr. Jenne Campus who is on-call for ENT today. ____________________________________________   FINAL CLINICAL IMPRESSION(S) / ED DIAGNOSES  Final diagnoses:  Acute sinusitis, recurrence not specified, unspecified location     ED Discharge Orders         Ordered    amoxicillin-clavulanate (AUGMENTIN) 875-125 MG tablet  2 times daily     04/23/20 1430    predniSONE (DELTASONE) 10 MG tablet     04/23/20 1430           Note:   This document was prepared using Dragon voice recognition software and may include unintentional dictation errors.    Tommi Rumps, PA-C 04/23/20 1438    Shaune Pollack, MD 04/25/20 1420

## 2020-04-23 NOTE — ED Notes (Signed)
Pt states that she has been congested for the last month and having ear pain. Pt states she's having pain in her face.   Pt denies nvd and shob.

## 2020-07-16 ENCOUNTER — Emergency Department: Payer: Self-pay

## 2020-07-16 ENCOUNTER — Other Ambulatory Visit: Payer: Self-pay

## 2020-07-16 ENCOUNTER — Emergency Department
Admission: EM | Admit: 2020-07-16 | Discharge: 2020-07-16 | Disposition: A | Discharge status: Home / Self Care | Payer: Self-pay | Attending: Emergency Medicine | Admitting: Emergency Medicine

## 2020-07-16 DIAGNOSIS — M5440 Lumbago with sciatica, unspecified side: Secondary | ICD-10-CM

## 2020-07-16 DIAGNOSIS — R2241 Localized swelling, mass and lump, right lower limb: Secondary | ICD-10-CM | POA: Insufficient documentation

## 2020-07-16 DIAGNOSIS — J449 Chronic obstructive pulmonary disease, unspecified: Secondary | ICD-10-CM | POA: Insufficient documentation

## 2020-07-16 DIAGNOSIS — M62838 Other muscle spasm: Secondary | ICD-10-CM | POA: Insufficient documentation

## 2020-07-16 DIAGNOSIS — M79661 Pain in right lower leg: Secondary | ICD-10-CM | POA: Insufficient documentation

## 2020-07-16 DIAGNOSIS — F17210 Nicotine dependence, cigarettes, uncomplicated: Secondary | ICD-10-CM | POA: Insufficient documentation

## 2020-07-16 DIAGNOSIS — I10 Essential (primary) hypertension: Secondary | ICD-10-CM | POA: Insufficient documentation

## 2020-07-16 DIAGNOSIS — E119 Type 2 diabetes mellitus without complications: Secondary | ICD-10-CM | POA: Insufficient documentation

## 2020-07-16 DIAGNOSIS — M5441 Lumbago with sciatica, right side: Secondary | ICD-10-CM | POA: Insufficient documentation

## 2020-07-16 LAB — GLUCOSE, CAPILLARY: Glucose-Capillary: 94 mg/dL (ref 70–99)

## 2020-07-16 MED ORDER — ORPHENADRINE CITRATE 30 MG/ML IJ SOLN
60.0000 mg | Freq: Two times a day (BID) | INTRAMUSCULAR | Status: DC
  Administered 2020-07-16: 60 mg via INTRAMUSCULAR
  Filled 2020-07-16: qty 2

## 2020-07-16 MED ORDER — HYDROMORPHONE HCL 1 MG/ML IJ SOLN
1.0000 mg | Freq: Once | INTRAMUSCULAR | Status: AC
  Administered 2020-07-16: 1 mg via INTRAMUSCULAR
  Filled 2020-07-16: qty 1

## 2020-07-16 MED ORDER — ONDANSETRON 8 MG PO TBDP
8.0000 mg | ORAL_TABLET | Freq: Once | ORAL | Status: AC
  Administered 2020-07-16: 8 mg via ORAL
  Filled 2020-07-16: qty 1

## 2020-07-16 MED ORDER — TRAMADOL HCL 50 MG PO TABS: 50.0000 mg | ORAL_TABLET | Freq: Four times a day (QID) | ORAL | 0 refills | Status: DC | PRN

## 2020-07-16 MED ORDER — METHYLPREDNISOLONE SODIUM SUCC 125 MG IJ SOLR
125.0000 mg | Freq: Once | INTRAMUSCULAR | Status: DC
  Filled 2020-07-16: qty 2

## 2020-07-16 MED ORDER — METHYLPREDNISOLONE 4 MG PO TBPK
ORAL_TABLET | ORAL | 0 refills | Status: DC
Start: 2020-07-16 — End: 2020-12-04

## 2020-07-16 MED ORDER — CYCLOBENZAPRINE HCL 10 MG PO TABS: 10.0000 mg | ORAL_TABLET | Freq: Three times a day (TID) | ORAL | 0 refills | Status: DC | PRN

## 2020-07-16 MED ORDER — ONDANSETRON HCL 8 MG PO TABS
8.0000 mg | ORAL_TABLET | Freq: Three times a day (TID) | ORAL | 0 refills | Status: DC | PRN
Start: 2020-07-16 — End: 2020-12-04

## 2020-07-16 NOTE — ED Notes (Signed)
See triage note  Presents with lower back pain  States she felt a "pop" to her back  Ambulates with slight limp

## 2020-07-16 NOTE — ED Provider Notes (Signed)
[facility] Emergency Department Provider Note   ____________________________________________   First MD Initiated Contact with Patient 07/16/20 1037     (approximate)  I have reviewed the triage vital signs and the nursing notes.   HISTORY  Chief Complaint Back Pain    HPI Connie Hernandez is a 54 y.o. female patient complaint of low back pain for proximally 3 weeks.  Patient states she felt a "pop" while turning over in pain which onset of complaint.  Patient states radicular component to the bilateral lower extremities.  Patient denies bladder or bowel dysfunction.  Patient taking excessive amount of Tylenol and I have voiced a concern for liver toxicity.  Patient refused lab works.  Patient rates pain as a 8/10.         Past Medical History:  Diagnosis Date   COPD (chronic obstructive pulmonary disease) (HCC)    Diabetes mellitus without complication (HCC)    Hyperlipemia    Hypertension     There are no problems to display for this patient.   Past Surgical History:  Procedure Laterality Date   APPENDECTOMY     CHOLECYSTECTOMY     FOOT SURGERY     HAND SURGERY     INGUINAL HERNIA REPAIR     KNEE SURGERY     NECK SURGERY     TONSILLECTOMY      Prior to Admission medications   Medication Sig Start Date End Date Taking? Authorizing Provider  cyclobenzaprine (FLEXERIL) 10 MG tablet Take 1 tablet (10 mg total) by mouth 3 (three) times daily as needed. 07/16/20   Joni Reining, PA-C  methylPREDNISolone (MEDROL DOSEPAK) 4 MG TBPK tablet Take Tapered dose as directed 07/16/20   Joni Reining, PA-C  ondansetron (ZOFRAN) 8 MG tablet Take 1 tablet (8 mg total) by mouth every 8 (eight) hours as needed for nausea or vomiting. 07/16/20   Joni Reining, PA-C  predniSONE (DELTASONE) 10 MG tablet Take 6 tablets  today, on day 2 take 5 tablets, day 3 take 4 tablets, day 4 take 3 tablets, day 5 take  2 tablets and 1 tablet the last day  04/23/20   Tommi Rumps, PA-C  traMADol (ULTRAM) 50 MG tablet Take 1 tablet (50 mg total) by mouth every 6 (six) hours as needed. 07/16/20 07/16/21  Joni Reining, PA-C    Allergies Dilaudid [hydromorphone hcl], Vicodin [hydrocodone-acetaminophen], and Wellbutrin [bupropion]  No family history on file.  Social History Social History   Tobacco Use   Smoking status: Current Every Day Smoker    Packs/day: 0.50    Types: Cigarettes   Smokeless tobacco: Never Used  Vaping Use   Vaping Use: Former  Substance Use Topics   Alcohol use: No   Drug use: No    Review of Systems Constitutional: No fever/chills Eyes: No visual changes. ENT: No sore throat. Cardiovascular: Denies chest pain. Respiratory: Denies shortness of breath.  History of COPD. Gastrointestinal: No abdominal pain.  No nausea, no vomiting.  No diarrhea.  No constipation. Genitourinary: Negative for dysuria. Musculoskeletal: Positive for back pain. Skin: Negative for rash. Neurological: Negative for headaches, focal weakness or numbness. Endocrine:  Diabetes, hyperlipidemia, hypertension. Allergic/Immunilogical: Dilaudid, Vicodin, and Wellbutrin. ____________________________________________   PHYSICAL EXAM:  VITAL SIGNS: ED Triage Vitals  Enc Vitals Group     BP 07/16/20 1009 (!) 151/103     Pulse Rate 07/16/20 1009 93     Resp 07/16/20 1009 18     Temp  07/16/20 1009 98.5 F (36.9 C)     Temp Source 07/16/20 1009 Oral     SpO2 07/16/20 1009 100 %     Weight 07/16/20 1017 190 lb (86.2 kg)     Height 07/16/20 1017 4\' 11"  (1.499 m)     Head Circumference --      Peak Flow --      Pain Score 07/16/20 1017 8     Pain Loc --      Pain Edu? --      Excl. in GC? --     Constitutional: Alert and oriented. Well appearing and in no acute distress. Cardiovascular: Normal rate, regular rhythm. Grossly normal heart sounds.  Good peripheral circulation.  Elevated blood pressure Respiratory: Normal  respiratory effort.  No retractions. Lungs CTAB. Gastrointestinal: Soft and nontender. No distention. No abdominal bruits. No CVA tenderness. Genitourinary: Deferred Musculoskeletal: No obvious lumbar deformity.  Patient is moderate guarding palpation T12-L3.  Bilateral peripheral spinal muscle spasm with lateral movements.  Right moderate guarding with palpation right calf. Neurologic:  Normal speech and language. No gross focal neurologic deficits are appreciated. No gait instability. Skin:  Skin is warm, dry and intact. No rash noted. Psychiatric: Mood and affect are normal. Speech and behavior are normal.  ____________________________________________   LABS (all labs ordered are listed, but only abnormal results are displayed)  Labs Reviewed  GLUCOSE, CAPILLARY  CBG MONITORING, ED   ____________________________________________  EKG   ____________________________________________  RADIOLOGY  ED MD interpretation:    Official radiology report(s): DG Lumbar Spine Complete  Result Date: 07/16/2020 CLINICAL DATA:  Back pain with radicular symptoms. EXAM: LUMBAR SPINE - COMPLETE 4+ VIEW COMPARISON:  09/19/2014. FINDINGS: Surgical clips right upper quadrant. Air-filled loops of small large bowel noted. Diffuse degenerative change lumbar spine. No acute bony abnormality identified. No evidence of fracture. Pelvic calcifications consistent phleboliths. Slightly prominent air-filled loops of small large bowel noted suggesting adynamic ileus IMPRESSION: 1. Diffuse degenerative changes lumbar spine. No acute bony abnormality identified. 2. Slightly prominent air-filled loops of small large bowel noted suggesting adynamic ileus. Electronically Signed   By: 11/19/2014  Register   On: 07/16/2020 11:16   09/15/2020 Venous Img Lower Unilateral Right  Result Date: 07/16/2020 CLINICAL DATA:  Lower calf pain and swelling for 3 weeks EXAM: RIGHT LOWER EXTREMITY VENOUS DOPPLER ULTRASOUND TECHNIQUE: Gray-scale  sonography with graded compression, as well as color Doppler and duplex ultrasound were performed to evaluate the lower extremity deep venous systems from the level of the common femoral vein and including the common femoral, femoral, profunda femoral, popliteal and calf veins including the posterior tibial, peroneal and gastrocnemius veins when visible. The superficial great saphenous vein was also interrogated. Spectral Doppler was utilized to evaluate flow at rest and with distal augmentation maneuvers in the common femoral, femoral and popliteal veins. COMPARISON:  None. FINDINGS: Contralateral Common Femoral Vein: Respiratory phasicity is normal and symmetric with the symptomatic side. No evidence of thrombus. Normal compressibility. Common Femoral Vein: No evidence of thrombus. Normal compressibility, respiratory phasicity and response to augmentation. Saphenofemoral Junction: No evidence of thrombus. Normal compressibility and flow on color Doppler imaging. Profunda Femoral Vein: No evidence of thrombus. Normal compressibility and flow on color Doppler imaging. Femoral Vein: No evidence of thrombus. Normal compressibility, respiratory phasicity and response to augmentation. Popliteal Vein: No evidence of thrombus. Normal compressibility, respiratory phasicity and response to augmentation. Calf Veins: No evidence of thrombus. Normal compressibility and flow on color Doppler imaging. IMPRESSION: No evidence of  deep venous thrombosis. Electronically Signed   By: Judie Petit.  Shick M.D.   On: 07/16/2020 13:49    ____________________________________________   PROCEDURES  Procedure(s) performed (including Critical Care):  Procedures   ____________________________________________   INITIAL IMPRESSION / ASSESSMENT AND PLAN / ED COURSE  As part of my medical decision making, I reviewed the following data within the electronic MEDICAL RECORD NUMBER     Patient presents with 3 weeks of low back pain secondary to a  "poping" sensation when turning over in bed.  Patient states radicular component to her right lower extremity.  Patient also complained of burning sensation to the right calf.  Differential consist of lumbar strain versus DDD, and DVT.  Discussed x-ray fracture patient consistent with degenerative changes of the right lumbar spine.  Discussed negative DVT findings of ultrasound.  Patient given discharge care instructions and advised to follow-up with PCP for continued care.      Clinical Course as of Jul 16 1408  Tue Jul 16, 2020  1320 US Venous Img Lower Unilateral Right [JJ]    Clinical Course User Index [JJ] Lois Huxley, [place]     ____________________________________________   FINAL CLINICAL IMPRESSION(S) / ED DIAGNOSES  Final diagnoses:  Acute right-sided low back pain with sciatica, sciatica laterality unspecified     ED Discharge Orders         Ordered    traMADol (ULTRAM) 50 MG tablet  Every 6 hours PRN     Discontinue  Reprint     07/16/20 1406    cyclobenzaprine (FLEXERIL) 10 MG tablet  3 times daily PRN     Discontinue  Reprint     07/16/20 1406    methylPREDNISolone (MEDROL DOSEPAK) 4 MG TBPK tablet     Discontinue  Reprint     07/16/20 1406    ondansetron (ZOFRAN) 8 MG tablet  Every 8 hours PRN     Discontinue  Reprint     07/16/20 1406           Note:  This document was prepared using Dragon voice recognition software and may include unintentional dictation errors.    Joni Reining, PA-C 07/16/20 1409    Chesley Noon, MD 07/17/20 1719

## 2020-07-16 NOTE — Discharge Instructions (Addendum)
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  Your ultrasound was negative for DVT.  Follow discharge care instructions and take medication as directed.  Follow-up with PCP for continued care.

## 2020-07-16 NOTE — ED Triage Notes (Signed)
Pt states she felt her lower back pop about 3 weeks ago and the pain has progressed since. Also c/o issues with sinus drainage that she was seen for here recently.

## 2020-08-23 ENCOUNTER — Emergency Department
Admission: EM | Admit: 2020-08-23 | Discharge: 2020-08-23 | Disposition: A | Discharge status: Home / Self Care | Payer: Self-pay | Attending: Emergency Medicine | Admitting: Emergency Medicine

## 2020-08-23 ENCOUNTER — Other Ambulatory Visit: Payer: Self-pay

## 2020-08-23 ENCOUNTER — Encounter: Payer: Self-pay | Admitting: Emergency Medicine

## 2020-08-23 DIAGNOSIS — J449 Chronic obstructive pulmonary disease, unspecified: Secondary | ICD-10-CM | POA: Insufficient documentation

## 2020-08-23 DIAGNOSIS — M25552 Pain in left hip: Secondary | ICD-10-CM | POA: Insufficient documentation

## 2020-08-23 DIAGNOSIS — Z79899 Other long term (current) drug therapy: Secondary | ICD-10-CM | POA: Insufficient documentation

## 2020-08-23 DIAGNOSIS — E119 Type 2 diabetes mellitus without complications: Secondary | ICD-10-CM | POA: Insufficient documentation

## 2020-08-23 DIAGNOSIS — F17210 Nicotine dependence, cigarettes, uncomplicated: Secondary | ICD-10-CM | POA: Insufficient documentation

## 2020-08-23 DIAGNOSIS — I10 Essential (primary) hypertension: Secondary | ICD-10-CM | POA: Insufficient documentation

## 2020-08-23 DIAGNOSIS — Z7952 Long term (current) use of systemic steroids: Secondary | ICD-10-CM | POA: Insufficient documentation

## 2020-08-23 DIAGNOSIS — M25551 Pain in right hip: Secondary | ICD-10-CM | POA: Insufficient documentation

## 2020-08-23 MED ORDER — CYCLOBENZAPRINE HCL 10 MG PO TABS
10.0000 mg | ORAL_TABLET | Freq: Three times a day (TID) | ORAL | 0 refills | Status: DC | PRN
Start: 2020-08-23 — End: 2020-12-04

## 2020-08-23 MED ORDER — CELECOXIB 200 MG PO CAPS
200.0000 mg | ORAL_CAPSULE | Freq: Two times a day (BID) | ORAL | 2 refills | Status: DC
Start: 2020-08-23 — End: 2021-03-10

## 2020-08-23 MED ORDER — TRAMADOL HCL 50 MG PO TABS: 50.0000 mg | ORAL_TABLET | Freq: Four times a day (QID) | ORAL | 0 refills | Status: AC | PRN

## 2020-08-23 NOTE — ED Triage Notes (Signed)
Pt presents via POV with c/o bilateral hip pain. Pt seen here recently and was not able to get medications due to financial issues. Pt able to ambulate, but c/o pain with ambulation. Pt able to triage room. Pt states at home she is now unable to lay on left side.   Pt also c/o reoccurring sinus congestion.

## 2020-08-23 NOTE — ED Provider Notes (Signed)
[facility] Emergency Department Provider Note   ____________________________________________   First MD Initiated Contact with Patient 08/23/20 1622     (approximate)  I have reviewed the triage vital signs and the nursing notes.   HISTORY  Chief Complaint Hip Pain    HPI Connie Hernandez is a 54 y.o. female with a stated past medical history of COPD and chronic tobacco abuse who presents for bilateral hip pain that began approximately 5 weeks ago and has been worsening since onset.  Patient states that she was told by [facility] orthopedics that she had avascular necrosis of bilateral hips but has been unable to get her prescribed pain medicine due to financial issues.  Patient states that her financial issues are no longer a problem but her prescriptions have expired.  Patient describes aching 9/10-10/10 bilateral hip pain that is worsened with walking or climbing stairs and partially relieved at rest.         Past Medical History:  Diagnosis Date  . COPD (chronic obstructive pulmonary disease) (HCC)   . Diabetes mellitus without complication (HCC)   . Hyperlipemia   . Hypertension     There are no problems to display for this patient.   Past Surgical History:  Procedure Laterality Date  . APPENDECTOMY    . CHOLECYSTECTOMY    . FOOT SURGERY    . HAND SURGERY    . INGUINAL HERNIA REPAIR    . KNEE SURGERY    . NECK SURGERY    . TONSILLECTOMY      Prior to Admission medications   Medication Sig Start Date End Date Taking? Authorizing Provider  celecoxib (CELEBREX) 200 MG capsule Take 1 capsule (200 mg total) by mouth 2 (two) times daily. 08/23/20 08/23/21  Merwyn Katos, MD  cyclobenzaprine (FLEXERIL) 10 MG tablet Take 1 tablet (10 mg total) by mouth 3 (three) times daily as needed for muscle spasms. 08/23/20   Merwyn Katos, MD  methylPREDNISolone (MEDROL DOSEPAK) 4 MG TBPK tablet Take Tapered dose as directed 07/16/20   Joni Reining,  PA-C  ondansetron (ZOFRAN) 8 MG tablet Take 1 tablet (8 mg total) by mouth every 8 (eight) hours as needed for nausea or vomiting. 07/16/20   Joni Reining, PA-C  predniSONE (DELTASONE) 10 MG tablet Take 6 tablets  today, on day 2 take 5 tablets, day 3 take 4 tablets, day 4 take 3 tablets, day 5 take  2 tablets and 1 tablet the last day 04/23/20   Tommi Rumps, PA-C  traMADol (ULTRAM) 50 MG tablet Take 1 tablet (50 mg total) by mouth every 6 (six) hours as needed for up to 5 days for severe pain. 08/23/20 08/28/20  Merwyn Katos, MD    Allergies Dilaudid [hydromorphone hcl], Vicodin [hydrocodone-acetaminophen], and Wellbutrin [bupropion]  History reviewed. No pertinent family history.  Social History Social History   Tobacco Use  . Smoking status: Current Every Day Smoker    Packs/day: 0.50    Types: Cigarettes  . Smokeless tobacco: Never Used  Vaping Use  . Vaping Use: Former  Substance Use Topics  . Alcohol use: No  . Drug use: No    Review of Systems Constitutional: No fever/chills Eyes: No visual changes. ENT: No sore throat. Cardiovascular: Denies chest pain. Respiratory: Denies shortness of breath. Gastrointestinal: No abdominal pain.  No nausea, no vomiting.  No diarrhea. Genitourinary: Negative for dysuria. Musculoskeletal: Endorses bilateral hip pain Skin: Negative for rash. Neurological: Negative for headaches, weakness/numbness/paresthesias  in any extremity Psychiatric: Negative for suicidal ideation/homicidal ideation   ____________________________________________   PHYSICAL EXAM:  VITAL SIGNS: ED Triage Vitals  Enc Vitals Group     BP 08/23/20 1522 (!) 141/94     Pulse Rate 08/23/20 1522 96     Resp 08/23/20 1522 20     Temp 08/23/20 1522 98.5 F (36.9 C)     Temp Source 08/23/20 1522 Oral     SpO2 08/23/20 1522 98 %     Weight 08/23/20 1544 190 lb 0.6 oz (86.2 kg)     Height 08/23/20 1544 4\' 11"  (1.499 m)     Head Circumference --      Peak  Flow --      Pain Score 08/23/20 1544 8     Pain Loc --      Pain Edu? --      Excl. in GC? --    Constitutional: Alert and oriented. Well appearing and in no acute distress. Eyes: Conjunctivae are normal. PERRL. EOMI. Head: Atraumatic. Nose: No congestion/rhinnorhea. Mouth/Throat: Mucous membranes are moist. Neck: No stridor Cardiovascular: Normal rate, regular rhythm. Grossly normal heart sounds.  Good peripheral circulation. Respiratory: Normal respiratory effort.  No retractions. Gastrointestinal: Soft and nontender. No distention. Musculoskeletal: Bilateral hip tenderness to palpation over her lateral hips and with active and passive range of motion.  No joint effusions. Neurologic:  Normal speech and language. No gross focal neurologic deficits are appreciated. Skin:  Skin is warm and dry. No rash noted. Psychiatric: Mood and affect are normal. Speech and behavior are normal.  ____________________________________________   LABS (all labs ordered are listed, but only abnormal results are displayed)  Labs Reviewed - No data to display ______________________________________________________________________   PROCEDURES  Procedure(s) performed (including Critical Care):  Procedures   ____________________________________________   INITIAL IMPRESSION / ASSESSMENT AND PLAN / ED COURSE        Patient presents for bilateral hip pain Given history, exam and workup I have low suspicion for fracture, dislocation, significant ligamentous injury, septic arthritis, gout flare, new autoimmune arthropathy, or gonococcal arthropathy.  Interventions: Imaging already performed previously and will not be repeated today as pain has not changed. Disposition: Discharge home with strict return precautions and instructions for prompt primary care follow up in the next week.      ____________________________________________   FINAL CLINICAL IMPRESSION(S) / ED DIAGNOSES  Final  diagnoses:  Acute pain of both hips     ED Discharge Orders         Ordered    cyclobenzaprine (FLEXERIL) 10 MG tablet  3 times daily PRN        08/23/20 1652    traMADol (ULTRAM) 50 MG tablet  Every 6 hours PRN        08/23/20 1652    celecoxib (CELEBREX) 200 MG capsule  2 times daily        08/23/20 1652           Note:  This document was prepared using Dragon voice recognition software and may include unintentional dictation errors.   10/23/20, MD 08/23/20 [phone]

## 2020-12-04 ENCOUNTER — Encounter: Payer: Self-pay | Admitting: Emergency Medicine

## 2020-12-04 ENCOUNTER — Emergency Department
Admission: EM | Admit: 2020-12-04 | Discharge: 2020-12-04 | Disposition: A | Discharge status: Home / Self Care | Payer: Self-pay | Attending: Emergency Medicine | Admitting: Emergency Medicine

## 2020-12-04 ENCOUNTER — Other Ambulatory Visit: Payer: Self-pay

## 2020-12-04 DIAGNOSIS — F17200 Nicotine dependence, unspecified, uncomplicated: Secondary | ICD-10-CM

## 2020-12-04 DIAGNOSIS — K140 Glossitis: Secondary | ICD-10-CM | POA: Insufficient documentation

## 2020-12-04 DIAGNOSIS — I10 Essential (primary) hypertension: Secondary | ICD-10-CM | POA: Insufficient documentation

## 2020-12-04 DIAGNOSIS — F17210 Nicotine dependence, cigarettes, uncomplicated: Secondary | ICD-10-CM | POA: Insufficient documentation

## 2020-12-04 DIAGNOSIS — E119 Type 2 diabetes mellitus without complications: Secondary | ICD-10-CM | POA: Insufficient documentation

## 2020-12-04 DIAGNOSIS — J449 Chronic obstructive pulmonary disease, unspecified: Secondary | ICD-10-CM | POA: Insufficient documentation

## 2020-12-04 DIAGNOSIS — J0140 Acute pansinusitis, unspecified: Secondary | ICD-10-CM | POA: Insufficient documentation

## 2020-12-04 LAB — COMPREHENSIVE METABOLIC PANEL
ALT: 13 U/L (ref 0–44)
AST: 17 U/L (ref 15–41)
Albumin: 4 g/dL (ref 3.5–5.0)
Alkaline Phosphatase: 84 U/L (ref 38–126)
Anion gap: 8 (ref 5–15)
BUN: 13 mg/dL (ref 6–20)
CO2: 29 mmol/L (ref 22–32)
Calcium: 9.3 mg/dL (ref 8.9–10.3)
Chloride: 100 mmol/L (ref 98–111)
Creatinine, Ser: 0.65 mg/dL (ref 0.44–1.00)
GFR, Estimated: >60 mL/min (ref >60)
Glucose, Bld: 100 mg/dL — ABNORMAL HIGH (ref 70–99)
Potassium: 4.4 mmol/L (ref 3.5–5.1)
Sodium: 137 mmol/L (ref 135–145)
Total Bilirubin: 0.3 mg/dL (ref 0.3–1.2)
Total Protein: 7.7 g/dL (ref 6.5–8.1)

## 2020-12-04 LAB — CBC WITH DIFFERENTIAL/PLATELET
Abs Immature Granulocytes: 0.02 10*3/uL (ref 0.00–0.07)
Basophils Absolute: 0.1 10*3/uL (ref 0.0–0.1)
Basophils Relative: 1 %
Eosinophils Absolute: 0.3 10*3/uL (ref 0.0–0.5)
Eosinophils Relative: 4 %
HCT: 41.9 % (ref 36.0–46.0)
Hemoglobin: 13.6 g/dL (ref 12.0–15.0)
Immature Granulocytes: 0 %
Lymphocytes Relative: 30 %
Lymphs Abs: 2.2 10*3/uL (ref 0.7–4.0)
MCH: 29.5 pg (ref 26.0–34.0)
MCHC: 32.5 g/dL (ref 30.0–36.0)
MCV: 90.9 fL (ref 80.0–100.0)
Monocytes Absolute: 0.5 10*3/uL (ref 0.1–1.0)
Monocytes Relative: 6 %
Neutro Abs: 4.4 10*3/uL (ref 1.7–7.7)
Neutrophils Relative %: 59 %
Platelets: 348 10*3/uL (ref 150–400)
RBC: 4.61 MIL/uL (ref 3.87–5.11)
RDW: 12.8 % (ref 11.5–15.5)
WBC: 7.5 10*3/uL (ref 4.0–10.5)
nRBC: 0 % (ref 0.0–0.2)

## 2020-12-04 MED ORDER — MAGIC MOUTHWASH
5.0000 mL | Freq: Four times a day (QID) | ORAL | 0 refills | Status: DC | PRN
Start: 2020-12-04 — End: 2021-03-10

## 2020-12-04 MED ORDER — PREDNISONE 10 MG PO TABS
ORAL_TABLET | ORAL | 0 refills | Status: DC
Start: 2020-12-04 — End: 2021-03-10

## 2020-12-04 MED ORDER — AMOXICILLIN-POT CLAVULANATE 875-125 MG PO TABS: 1.0000 | ORAL_TABLET | Freq: Two times a day (BID) | ORAL | 0 refills | Status: AC

## 2020-12-04 NOTE — ED Notes (Signed)
See triage note, pt c.o headache with ear pain and "knots in mouth" that started 3 weeks ago. Denies fevers.  Denies trouble eating or drinking Pt in NAD

## 2020-12-04 NOTE — ED Triage Notes (Signed)
Pt reports recently treated for a sinus infection and now has sores in her mouth and started with a fever last pm.

## 2020-12-04 NOTE — ED Provider Notes (Signed)
[facility] Emergency Department Provider Note  ____________________________________________   Event Date/Time   First MD Initiated Contact with Patient 12/04/20 1152     (approximate)  I have reviewed the triage vital signs and the nursing notes.   HISTORY  Chief Complaint Mouth Lesions   HPI Connie Hernandez is a 54 y.o. female presents to the ED with complaint of ear pain, headache, lesions on her tongue that started 3 weeks ago.  Patient denies any fever, difficulty eating or drinking.  Patient states that she did not receive any antibiotics for her sinus infection.  She reports that she had fever last p.m. but is afebrile at this time.  Patient continues to smoke and reports that she is smoked for 40 years.  She also reports facial pain which always happens when she "has a sinus infection".  She also states that she has had "13 Covid test in the past and they have all been negative."  Patient adamantly is opposed to having a Covid test done today.  She rates her pain as 4 out of 10.       Past Medical History:  Diagnosis Date  . COPD (chronic obstructive pulmonary disease) (HCC)   . Diabetes mellitus without complication (HCC)   . Hyperlipemia   . Hypertension     There are no problems to display for this patient.   Past Surgical History:  Procedure Laterality Date  . APPENDECTOMY    . CHOLECYSTECTOMY    . FOOT SURGERY    . HAND SURGERY    . INGUINAL HERNIA REPAIR    . KNEE SURGERY    . NECK SURGERY    . TONSILLECTOMY      Prior to Admission medications   Medication Sig Start Date End Date Taking? Authorizing Provider  amoxicillin-clavulanate (AUGMENTIN) 875-125 MG tablet Take 1 tablet by mouth 2 (two) times daily for 10 days. 12/04/20 12/14/20  Tommi Rumps, PA-C  celecoxib (CELEBREX) 200 MG capsule Take 1 capsule (200 mg total) by mouth 2 (two) times daily. 08/23/20 08/23/21  Merwyn Katos, MD  magic mouthwash SOLN Take 5 mLs by  mouth 4 (four) times daily as needed for mouth pain. 12/04/20   Tommi Rumps, PA-C  predniSONE (DELTASONE) 10 MG tablet Take 6 tablets  today, on day 2 take 5 tablets, day 3 take 4 tablets, day 4 take 3 tablets, day 5 take  2 tablets and 1 tablet the last day 12/04/20   Tommi Rumps, PA-C    Allergies Dilaudid [hydromorphone hcl], Vicodin [hydrocodone-acetaminophen], and Wellbutrin [bupropion]  No family history on file.  Social History Social History   Tobacco Use  . Smoking status: Current Every Day Smoker    Packs/day: 0.50    Types: Cigarettes  . Smokeless tobacco: Never Used  Vaping Use  . Vaping Use: Former  Substance Use Topics  . Alcohol use: No  . Drug use: No    Review of Systems Constitutional: Subjective fever/chills Eyes: No visual changes. ENT: No sore throat.  Positive lesions on tongue.  Positive sinus pressure. Cardiovascular: Denies chest pain. Respiratory: Denies shortness of breath.  Denies cough. Gastrointestinal: No abdominal pain.  No nausea, no vomiting.   Musculoskeletal: Negative for back pain. Skin: Negative for rash. Neurological: Negative for headaches, focal weakness or numbness. ____________________________________________   PHYSICAL EXAM:  VITAL SIGNS: ED Triage Vitals  Enc Vitals Group     BP 12/04/20 1158 (!) 135/91     Pulse  Rate 12/04/20 1158 87     Resp 12/04/20 1158 18     Temp 12/04/20 1158 98.3 F (36.8 C)     Temp Source 12/04/20 1158 Oral     SpO2 12/04/20 1158 100 %     Weight 12/04/20 1006 190 lb 0.6 oz (86.2 kg)     Height 12/04/20 1006 5\' 1"  (1.549 m)     Head Circumference --      Peak Flow --      Pain Score 12/04/20 1006 4     Pain Loc --      Pain Edu? --      Excl. in GC? --     Constitutional: Alert and oriented. Well appearing and in no acute distress. Eyes: Conjunctivae are normal.  Head: Atraumatic. Nose: Mild congestion/rhinnorhea.  Tender sinuses to percussion x4. Mouth/Throat: Mucous  membranes are moist.  Oropharynx non-erythematous.  There is some irritated papillae noted on inspection of the tongue.  No ulcerative lesions noted on the buccal mucosa. Neck: No stridor.   Hematological/Lymphatic/Immunilogical: No cervical lymphadenopathy. Cardiovascular: Normal rate, regular rhythm. Grossly normal heart sounds.  Good peripheral circulation. Respiratory: Normal respiratory effort.  No retractions. Lungs CTAB. Gastrointestinal: Soft and nontender. Musculoskeletal: No lower extremity tenderness nor edema.  No joint effusions. Neurologic:  Normal speech and language. No gross focal neurologic deficits are appreciated. No gait instability. Skin:  Skin is warm, dry and intact. No rash noted. Psychiatric: Mood and affect are normal. Speech and behavior are normal.  ____________________________________________   LABS (all labs ordered are listed, but only abnormal results are displayed)  Labs Reviewed  COMPREHENSIVE METABOLIC PANEL - Abnormal; Notable for the following components:      Result Value   Glucose, Bld 100 (*)    All other components within normal limits  CBC WITH DIFFERENTIAL/PLATELET    PROCEDURES  Procedure(s) performed (including Critical Care):  Procedures   ____________________________________________   INITIAL IMPRESSION / ASSESSMENT AND PLAN / ED COURSE  As part of my medical decision making, I reviewed the following data within the electronic MEDICAL RECORD NUMBER Notes from prior ED visits and [place] Controlled Substance Database  54 year old female presents to the ED with complaint of sinus pain and pressure along with sores in her mouth and fever that started last evening.  Patient states that she also had some ear pain and that the lesions actually began approximately 3 weeks ago.  Patient is a smoker for 40 years.  Patient declined having a Covid influenza test done.  CMP and CBC were all within normal limits.  Patient was tender on palpation sinuses  x4.  Patient was encouraged to follow-up with her PCP or Dr. 53 who is on-call for [facility] if any continued problems.  Augmentin 875 twice daily for 10 days along with a tapering dose of prednisone and Magic mouthwash.  Patient is to return to the emergency department over the holiday weekend if any worsening of her symptoms. ____________________________________________   FINAL CLINICAL IMPRESSION(S) / ED DIAGNOSES  Final diagnoses:  Acute pansinusitis, recurrence not specified  Glossitis  Current every day smoker     ED Discharge Orders         Ordered    amoxicillin-clavulanate (AUGMENTIN) 875-125 MG tablet  2 times daily        12/04/20 1353    predniSONE (DELTASONE) 10 MG tablet        12/04/20 1353    magic mouthwash SOLN  4 times daily PRN  Note to Pharmacy: Equal parts diphenhydramine, Maalox, nystatin   12/04/20 1353          *Please note:  Connie Hernandez was evaluated in Emergency Department on 12/04/2020 for the symptoms described in the history of present illness. She was evaluated in the context of the global COVID-19 pandemic, which necessitated consideration that the patient might be at risk for infection with the SARS-CoV-2 virus that causes COVID-19. Institutional protocols and algorithms that pertain to the evaluation of patients at risk for COVID-19 are in a state of rapid change based on information released by regulatory bodies including the CDC and federal and state organizations. These policies and algorithms were followed during the patient's care in the ED.  Some ED evaluations and interventions may be delayed as a result of limited staffing during and the pandemic.*   Note:  This document was prepared using Dragon voice recognition software and may include unintentional dictation errors.    Tommi Rumps, PA-C 12/04/20 1510    Dionne Bucy, MD 12/04/20 [phone]

## 2020-12-04 NOTE — ED Notes (Signed)
Pt alert and oriented X 4, stable for discharge. RR even and unlabored, color WNL. Discussed discharge instructions and follow-up as directed. Discharge medications discussed if prescribed. Pt had opportunity to ask questions, and RN to provide patient/family eduction.  

## 2020-12-04 NOTE — Discharge Instructions (Signed)
Follow-up with your primary care provider and also Dr. Willeen Cass who is on-call for ear nose and throat if you continue to have problems with your tongue or throat.  Augmentin 875 twice daily for 10 days, Magic mouthwash and prednisone was sent to your pharmacy.  Drink lots of fluids stay hydrated.You may also use Tylenol if needed for pain.

## 2021-03-10 ENCOUNTER — Other Ambulatory Visit: Payer: Self-pay

## 2021-03-10 ENCOUNTER — Emergency Department
Admission: EM | Admit: 2021-03-10 | Discharge: 2021-03-10 | Disposition: A | Discharge status: Home / Self Care | Payer: Self-pay | Attending: Emergency Medicine | Admitting: Emergency Medicine

## 2021-03-10 ENCOUNTER — Encounter: Payer: Self-pay | Admitting: Emergency Medicine

## 2021-03-10 DIAGNOSIS — J449 Chronic obstructive pulmonary disease, unspecified: Secondary | ICD-10-CM | POA: Insufficient documentation

## 2021-03-10 DIAGNOSIS — E119 Type 2 diabetes mellitus without complications: Secondary | ICD-10-CM | POA: Insufficient documentation

## 2021-03-10 DIAGNOSIS — F17210 Nicotine dependence, cigarettes, uncomplicated: Secondary | ICD-10-CM | POA: Insufficient documentation

## 2021-03-10 DIAGNOSIS — I10 Essential (primary) hypertension: Secondary | ICD-10-CM | POA: Insufficient documentation

## 2021-03-10 DIAGNOSIS — G8929 Other chronic pain: Secondary | ICD-10-CM

## 2021-03-10 DIAGNOSIS — Z79899 Other long term (current) drug therapy: Secondary | ICD-10-CM | POA: Insufficient documentation

## 2021-03-10 DIAGNOSIS — M25551 Pain in right hip: Secondary | ICD-10-CM

## 2021-03-10 DIAGNOSIS — M5416 Radiculopathy, lumbar region: Secondary | ICD-10-CM | POA: Insufficient documentation

## 2021-03-10 LAB — COMPREHENSIVE METABOLIC PANEL
ALT: 13 U/L (ref 0–44)
AST: 17 U/L (ref 15–41)
Albumin: 4.5 g/dL (ref 3.5–5.0)
Alkaline Phosphatase: 75 U/L (ref 38–126)
Anion gap: 10 (ref 5–15)
BUN: 13 mg/dL (ref 6–20)
CO2: 22 mmol/L (ref 22–32)
Calcium: 9.4 mg/dL (ref 8.9–10.3)
Chloride: 106 mmol/L (ref 98–111)
Creatinine, Ser: 0.63 mg/dL (ref 0.44–1.00)
GFR, Estimated: >60 mL/min (ref >60)
Glucose, Bld: 88 mg/dL (ref 70–99)
Potassium: 4.3 mmol/L (ref 3.5–5.1)
Sodium: 138 mmol/L (ref 135–145)
Total Bilirubin: 0.6 mg/dL (ref 0.3–1.2)
Total Protein: 8.1 g/dL (ref 6.5–8.1)

## 2021-03-10 LAB — URINALYSIS, COMPLETE (UACMP) WITH MICROSCOPIC
Bilirubin Urine: NEGATIVE
Glucose, UA: NEGATIVE mg/dL
Hgb urine dipstick: NEGATIVE
Ketones, ur: NEGATIVE mg/dL
Leukocytes,Ua: NEGATIVE
Nitrite: NEGATIVE
Protein, ur: NEGATIVE mg/dL
Specific Gravity, Urine: 1.002 — ABNORMAL LOW (ref 1.005–1.030)
pH: 5 (ref 5.0–8.0)

## 2021-03-10 LAB — CBC WITH DIFFERENTIAL/PLATELET
Abs Immature Granulocytes: 0.02 10*3/uL (ref 0.00–0.07)
Basophils Absolute: 0.1 10*3/uL (ref 0.0–0.1)
Basophils Relative: 1 %
Eosinophils Absolute: 0.1 10*3/uL (ref 0.0–0.5)
Eosinophils Relative: 2 %
HCT: 45 % (ref 36.0–46.0)
Hemoglobin: 15 g/dL (ref 12.0–15.0)
Immature Granulocytes: 0 %
Lymphocytes Relative: 30 %
Lymphs Abs: 2.3 10*3/uL (ref 0.7–4.0)
MCH: 30.1 pg (ref 26.0–34.0)
MCHC: 33.3 g/dL (ref 30.0–36.0)
MCV: 90.4 fL (ref 80.0–100.0)
Monocytes Absolute: 0.6 10*3/uL (ref 0.1–1.0)
Monocytes Relative: 7 %
Neutro Abs: 4.6 10*3/uL (ref 1.7–7.7)
Neutrophils Relative %: 60 %
Platelets: 380 10*3/uL (ref 150–400)
RBC: 4.98 MIL/uL (ref 3.87–5.11)
RDW: 13.2 % (ref 11.5–15.5)
WBC: 7.7 10*3/uL (ref 4.0–10.5)
nRBC: 0 % (ref 0.0–0.2)

## 2021-03-10 MED ORDER — AMLODIPINE BESYLATE 10 MG PO TABS: 10.0000 mg | ORAL_TABLET | Freq: Every day | ORAL | 2 refills | Status: AC

## 2021-03-10 MED ORDER — ORPHENADRINE CITRATE 30 MG/ML IJ SOLN
60.0000 mg | INTRAMUSCULAR | Status: AC
  Administered 2021-03-10: 60 mg via INTRAMUSCULAR
  Filled 2021-03-10: qty 2

## 2021-03-10 MED ORDER — KETOROLAC TROMETHAMINE 30 MG/ML IJ SOLN
30.0000 mg | Freq: Once | INTRAMUSCULAR | Status: AC
  Administered 2021-03-10: 30 mg via INTRAMUSCULAR
  Filled 2021-03-10: qty 1

## 2021-03-10 MED ORDER — KETOROLAC TROMETHAMINE 10 MG PO TABS: 10.0000 mg | ORAL_TABLET | Freq: Three times a day (TID) | ORAL | 0 refills | Status: DC

## 2021-03-10 MED ORDER — PROPRANOLOL HCL 20 MG PO TABS: 20.0000 mg | ORAL_TABLET | Freq: Two times a day (BID) | ORAL | 2 refills | Status: AC

## 2021-03-10 NOTE — ED Notes (Signed)
Pt given food tray with verbal okay from provider JB. Pt reports is diabetic. Given diet soda of choice.

## 2021-03-10 NOTE — ED Notes (Signed)
See triage note  Presents with right hip pain  States pain has been going on for about 6 months  States pain moves into right leg  Denies any injury  States she also feels like her upper leg is warm to touch  Has been seen for same in past

## 2021-03-10 NOTE — ED Notes (Signed)
Pt refused diet soda and asked for regular soda. Educated. Regular soda given as requested.

## 2021-03-10 NOTE — ED Notes (Signed)
Verbal okay from provider JB to give both meds Iv since pt has Iv access. Provider JB confirms can give same dose IV instead of IM.

## 2021-03-10 NOTE — ED Triage Notes (Signed)
Pt c/o R hip pain that has been going on for the past 6 months. Pt states the pain can sometime radiate to her lower back. Denies any swelling. Pt states it warm to the touch. Pt is A&Ox4 and NAD. Ambulatory to room. Pt has been seen in the past and says X-Ray is all they have done

## 2021-03-10 NOTE — ED Provider Notes (Signed)
[facility] Emergency Department Provider Note ____________________________________________  Time seen: 1403  I have reviewed the triage vital signs and the nursing notes.  HISTORY  Chief Complaint  Hip Pain  HPI Connie Hernandez is a 55 y.o. female presents to the ED for evaluation of chronic hip pain on the right. She also notes some LBP and turned over her elevated blood pressure.  According to the patient, she been out of her blood pressure medicine for least a year.  She claims she has not been able to establish routine medical care here although the chart, reveals she is been in the area for at least the last 5 years.  Patient is otherwise stable and without signs of acute distress or acute hypertensive emergency.  She is relaying some complaints of feeling unwell in general.  She describes a near syncopal episode at home several days earlier, where she assumed her blood pressure was elevated.  She describes experiencing visual disturbance like lights flashing across her eyes, but did not have loss of consciousness, slurred speech, facial paralysis, or weakness.  She denies any chest pain, shortness of breath, cough, congestion, nausea, vomiting, or constipation.  To the patient, she is previously been treated with meloxicam and cyclobenzaprine as well as a short course of gabapentin for her chronic hip pain.  She has since discontinued all medications.   Past Medical History:  Diagnosis Date  . COPD (chronic obstructive pulmonary disease) (HCC)   . Diabetes mellitus without complication (HCC)   . Hyperlipemia   . Hypertension     There are no problems to display for this patient.   Past Surgical History:  Procedure Laterality Date  . APPENDECTOMY    . CHOLECYSTECTOMY    . FOOT SURGERY    . HAND SURGERY    . INGUINAL HERNIA REPAIR    . KNEE SURGERY    . NECK SURGERY    . TONSILLECTOMY      Prior to Admission medications   Medication Sig Start Date  End Date Taking? Authorizing Provider  amLODipine (NORVASC) 10 MG tablet Take 1 tablet (10 mg total) by mouth daily. 03/10/21 06/08/21 Yes Shan Valdes, Charlesetta Ivory, PA-C  ketorolac (TORADOL) 10 MG tablet Take 1 tablet (10 mg total) by mouth every 8 (eight) hours. 03/10/21  Yes Pascale Maves, Charlesetta Ivory, PA-C  propranolol (INDERAL) 20 MG tablet Take 1 tablet (20 mg total) by mouth 2 (two) times daily. 03/10/21 06/08/21 Yes Anish Vana, Charlesetta Ivory, PA-C    Allergies Dilaudid [hydromorphone hcl], Vicodin [hydrocodone-acetaminophen], and Wellbutrin [bupropion]  History reviewed. No pertinent family history.  Social History Social History   Tobacco Use  . Smoking status: Current Every Day Smoker    Packs/day: 0.50    Types: Cigarettes  . Smokeless tobacco: Never Used  Vaping Use  . Vaping Use: Former  Substance Use Topics  . Alcohol use: No  . Drug use: No    Review of Systems  Constitutional: Negative for fever. Cardiovascular: Negative for chest pain. Respiratory: Negative for shortness of breath. Gastrointestinal: Negative for abdominal pain, vomiting and diarrhea. Genitourinary: Negative for dysuria. Musculoskeletal: Positive for lower back and right hip pain. Skin: Negative for rash. Neurological: Negative for headaches, focal weakness or numbness. ____________________________________________  PHYSICAL EXAM:  VITAL SIGNS: ED Triage Vitals  Enc Vitals Group     BP 03/10/21 1333 (!) 158/95     Pulse Rate 03/10/21 1333 82     Resp 03/10/21 1333 20  Temp 03/10/21 1333 98 F (36.7 C)     Temp Source 03/10/21 1333 Oral     SpO2 03/10/21 1333 100 %     Weight 03/10/21 1334 138 lb (62.6 kg)     Height 03/10/21 1334 5\' 1"  (1.549 m)     Head Circumference --      Peak Flow --      Pain Score 03/10/21 1334 10     Pain Loc --      Pain Edu? --      Excl. in GC? --     Constitutional: Alert and oriented. Well appearing and in no distress. Head: Normocephalic and  atraumatic. Eyes: Conjunctivae are normal. Normal extraocular movements Neck: Supple. No thyromegaly. Hematological/Lymphatic/Immunological: No cervical lymphadenopathy. Cardiovascular: Normal rate, regular rhythm. Normal distal pulses. Respiratory: Normal respiratory effort. No wheezes/rales/rhonchi. Gastrointestinal: Soft and nontender. No distention. Musculoskeletal: Normal spinal alignment without midline tenderness, spasm, vomiting, or step-off.  Normal active range of motion of the hips bilaterally.  No crepitus appreciated.  Nontender with normal range of motion in all extremities.  Neurologic: Cranial nerves II to XII grossly intact.  Normal LE DTRs.  Normal toe flexion bilaterally.  Normal foot eversion bilaterally.  Normal gait without ataxia. Normal speech and language. No gross focal neurologic deficits are appreciated. ____________________________________________   LABS (pertinent positives/negatives) Labs Reviewed  URINALYSIS, COMPLETE (UACMP) WITH MICROSCOPIC - Abnormal; Notable for the following components:      Result Value   Color, Urine STRAW (*)    APPearance CLEAR (*)    Specific Gravity, Urine 1.002 (*)    Bacteria, UA RARE (*)    All other components within normal limits  COMPREHENSIVE METABOLIC PANEL  CBC WITH DIFFERENTIAL/PLATELET  ____________________________________________  PROCEDURES  Toradol 30 mg IVP Norflex 60 mg IVP  Procedures ____________________________________________  INITIAL IMPRESSION / ASSESSMENT AND PLAN / ED COURSE  DDX: chronic hip DJD, lumbar radiculopathy, lumbar DDD, sciatica  Patient ED evaluation of nontraumatic chronic hip pain on the right, and persistent low back pain.  Patient with previous evaluations and work-ups that have revealed underlying DDD on the lumbar spine, and bone spurs and osteophytes on the hip.  No acute injury or fracture prompting Imaging at this time.  Patient is without bladder or bowel incontinence, foot  drop, or saddle anesthesias. She is discharged with a courtesy refill of her BP meds and a Toradol Rx. She is referred to a local community clinic   Lyanne Kates was evaluated in Emergency Department on 03/10/2021 for the symptoms described in the history of present illness. She was evaluated in the context of the global COVID-19 pandemic, which necessitated consideration that the patient might be at risk for infection with the SARS-CoV-2 virus that causes COVID-19. Institutional protocols and algorithms that pertain to the evaluation of patients at risk for COVID-19 are in a state of rapid change based on information released by regulatory bodies including the CDC and federal and state organizations. These policies and algorithms were followed during the patient's care in the ED. ____________________________________________  FINAL CLINICAL IMPRESSION(S) / ED DIAGNOSES  Final diagnoses:  Chronic right hip pain  Lumbar radiculopathy  Uncontrolled hypertension      Adenike Shidler, 03/12/2021, PA-C 03/10/21 03/12/21, MD 03/11/21 0700

## 2021-03-10 NOTE — ED Notes (Signed)
Pt given sterile specimen cup and edcuted that urine sample needed when possible.

## 2021-03-10 NOTE — Discharge Instructions (Addendum)
Your exam is consistent with chronic pain from your hip and lumbar degenerative disc disease. Take the prescription meds as directed. Select a local community clinic for routine medical care. Follow-up with Ortho for continued symptoms and further evaluation.

## 2021-03-13 ENCOUNTER — Telehealth: Payer: Self-pay | Admitting: Gerontology

## 2021-03-13 NOTE — Telephone Encounter (Signed)
LVM to patient after receiving an ER referral

## 2021-06-30 ENCOUNTER — Other Ambulatory Visit: Payer: Self-pay

## 2021-06-30 ENCOUNTER — Encounter: Payer: Self-pay | Admitting: Emergency Medicine

## 2021-06-30 ENCOUNTER — Emergency Department: Payer: Self-pay

## 2021-06-30 ENCOUNTER — Emergency Department
Admission: EM | Admit: 2021-06-30 | Discharge: 2021-06-30 | Disposition: A | Discharge status: Home / Self Care | Payer: Self-pay | Attending: Emergency Medicine | Admitting: Emergency Medicine

## 2021-06-30 DIAGNOSIS — Z79899 Other long term (current) drug therapy: Secondary | ICD-10-CM | POA: Insufficient documentation

## 2021-06-30 DIAGNOSIS — R1031 Right lower quadrant pain: Secondary | ICD-10-CM

## 2021-06-30 DIAGNOSIS — I10 Essential (primary) hypertension: Secondary | ICD-10-CM | POA: Insufficient documentation

## 2021-06-30 DIAGNOSIS — F17210 Nicotine dependence, cigarettes, uncomplicated: Secondary | ICD-10-CM | POA: Insufficient documentation

## 2021-06-30 DIAGNOSIS — J449 Chronic obstructive pulmonary disease, unspecified: Secondary | ICD-10-CM | POA: Insufficient documentation

## 2021-06-30 DIAGNOSIS — R1030 Lower abdominal pain, unspecified: Secondary | ICD-10-CM | POA: Insufficient documentation

## 2021-06-30 DIAGNOSIS — E119 Type 2 diabetes mellitus without complications: Secondary | ICD-10-CM | POA: Insufficient documentation

## 2021-06-30 LAB — URINALYSIS, COMPLETE (UACMP) WITH MICROSCOPIC
Bilirubin Urine: NEGATIVE
Glucose, UA: NEGATIVE mg/dL
Hgb urine dipstick: NEGATIVE
Ketones, ur: NEGATIVE mg/dL
Leukocytes,Ua: NEGATIVE
Nitrite: NEGATIVE
Protein, ur: NEGATIVE mg/dL
Specific Gravity, Urine: 1.01 (ref 1.005–1.030)
pH: 5 (ref 5.0–8.0)

## 2021-06-30 LAB — CBC WITH DIFFERENTIAL/PLATELET
Abs Immature Granulocytes: 0.02 10*3/uL (ref 0.00–0.07)
Basophils Absolute: 0.1 10*3/uL (ref 0.0–0.1)
Basophils Relative: 1 %
Eosinophils Absolute: 0.1 10*3/uL (ref 0.0–0.5)
Eosinophils Relative: 2 %
HCT: 42.6 % (ref 36.0–46.0)
Hemoglobin: 14 g/dL (ref 12.0–15.0)
Immature Granulocytes: 0 %
Lymphocytes Relative: 32 %
Lymphs Abs: 2.2 10*3/uL (ref 0.7–4.0)
MCH: 30.7 pg (ref 26.0–34.0)
MCHC: 32.9 g/dL (ref 30.0–36.0)
MCV: 93.4 fL (ref 80.0–100.0)
Monocytes Absolute: 0.5 10*3/uL (ref 0.1–1.0)
Monocytes Relative: 7 %
Neutro Abs: 4 10*3/uL (ref 1.7–7.7)
Neutrophils Relative %: 58 %
Platelets: 348 10*3/uL (ref 150–400)
RBC: 4.56 MIL/uL (ref 3.87–5.11)
RDW: 13.1 % (ref 11.5–15.5)
WBC: 6.8 10*3/uL (ref 4.0–10.5)
nRBC: 0 % (ref 0.0–0.2)

## 2021-06-30 LAB — COMPREHENSIVE METABOLIC PANEL
ALT: 14 U/L (ref 0–44)
AST: 20 U/L (ref 15–41)
Albumin: 4.4 g/dL (ref 3.5–5.0)
Alkaline Phosphatase: 77 U/L (ref 38–126)
Anion gap: 9 (ref 5–15)
BUN: 11 mg/dL (ref 6–20)
CO2: 30 mmol/L (ref 22–32)
Calcium: 9.8 mg/dL (ref 8.9–10.3)
Chloride: 102 mmol/L (ref 98–111)
Creatinine, Ser: 0.73 mg/dL (ref 0.44–1.00)
GFR, Estimated: >60 mL/min (ref >60)
Glucose, Bld: 101 mg/dL — ABNORMAL HIGH (ref 70–99)
Potassium: 4.1 mmol/L (ref 3.5–5.1)
Sodium: 141 mmol/L (ref 135–145)
Total Bilirubin: 0.5 mg/dL (ref 0.3–1.2)
Total Protein: 7.8 g/dL (ref 6.5–8.1)

## 2021-06-30 LAB — LIPASE, BLOOD: Lipase: 23 U/L (ref 11–51)

## 2021-06-30 MED ORDER — KETOROLAC TROMETHAMINE 30 MG/ML IJ SOLN
30.0000 mg | Freq: Once | INTRAMUSCULAR | Status: AC
  Administered 2021-06-30: 30 mg via INTRAMUSCULAR
  Filled 2021-06-30: qty 1

## 2021-06-30 MED ORDER — MELOXICAM 15 MG PO TABS: 15.0000 mg | ORAL_TABLET | Freq: Every day | ORAL | 0 refills | Status: AC

## 2021-06-30 MED ORDER — IOHEXOL 300 MG/ML  SOLN
75.0000 mL | Freq: Once | INTRAMUSCULAR | Status: AC | PRN
  Administered 2021-06-30: 75 mL via INTRAVENOUS
  Filled 2021-06-30: qty 75

## 2021-06-30 NOTE — ED Provider Notes (Signed)
[facility]-EMERGENCY DEPARTMENT  ____________________________________________  Time seen: Approximately 4:42 PM  I have reviewed the triage vital signs and the nursing notes.   HISTORY  Chief Complaint Groin Pain   Historian Patient     HPI Connie Hernandez is a 55 y.o. female presents to the emergency department with right-sided groin pain since a fall that occurred 2 weeks ago.  Patient states that she has had a prior inguinal hernia repair and is concerned for reinjury.  She has been able to bear weight and states that she has increasing pain with ambulation.  No fever or chills.  No nausea or vomiting.  Patient denies chest pain, chest.  No other alleviating measures have been attempted.   Past Medical History:  Diagnosis Date   COPD (chronic obstructive pulmonary disease) (HCC)    Diabetes mellitus without complication (HCC)    Hyperlipemia    Hypertension      Immunizations up to date:  Yes.     Past Medical History:  Diagnosis Date   COPD (chronic obstructive pulmonary disease) (HCC)    Diabetes mellitus without complication (HCC)    Hyperlipemia    Hypertension     There are no problems to display for this patient.   Past Surgical History:  Procedure Laterality Date   APPENDECTOMY     CHOLECYSTECTOMY     FOOT SURGERY     HAND SURGERY     INGUINAL HERNIA REPAIR     KNEE SURGERY     NECK SURGERY     TONSILLECTOMY      Prior to Admission medications   Medication Sig Start Date End Date Taking? Authorizing Provider  meloxicam (MOBIC) 15 MG tablet Take 1 tablet (15 mg total) by mouth daily for 7 days. 06/30/21 07/07/21 Yes Pia Mau M, PA-C  amLODipine (NORVASC) 10 MG tablet Take 1 tablet (10 mg total) by mouth daily. 03/10/21 06/08/21  Menshew, Charlesetta Ivory, PA-C  propranolol (INDERAL) 20 MG tablet Take 1 tablet (20 mg total) by mouth 2 (two) times daily. 03/10/21 06/08/21  Menshew, Charlesetta Ivory, PA-C    Allergies Dilaudid [hydromorphone hcl],  Vicodin [hydrocodone-acetaminophen], and Wellbutrin [bupropion]  No family history on file.  Social History Social History   Tobacco Use   Smoking status: Every Day    Packs/day: 0.50    Types: Cigarettes   Smokeless tobacco: Never  Vaping Use   Vaping Use: Former  Substance Use Topics   Alcohol use: No   Drug use: No     Review of Systems  Constitutional: No fever/chills Eyes:  No discharge ENT: No upper respiratory complaints. Respiratory: no cough. No SOB/ use of accessory muscles to breath Gastrointestinal:   No nausea, no vomiting.  No diarrhea.  No constipation. Genitourinary: Patient has right sided groin pain.  Musculoskeletal: Negative for musculoskeletal pain. Skin: Negative for rash, abrasions, lacerations, ecchymosis.   ____________________________________________   PHYSICAL EXAM:  VITAL SIGNS: ED Triage Vitals  Enc Vitals Group     BP 06/30/21 1458 (!) 146/99     Pulse Rate 06/30/21 1458 90     Resp 06/30/21 1458 16     Temp 06/30/21 1458 98.2 F (36.8 C)     Temp Source 06/30/21 1458 Oral     SpO2 06/30/21 1458 98 %     Weight 06/30/21 1459 138 lb 0.1 oz (62.6 kg)     Height 06/30/21 1459 5\' 1"  (1.549 m)     Head Circumference --  Peak Flow --      Pain Score 06/30/21 1459 8     Pain Loc --      Pain Edu? --      Excl. in GC? --      Constitutional: Alert and oriented. Well appearing and in no acute distress. Eyes: Conjunctivae are normal. PERRL. EOMI. Head: Atraumatic. ENT:      Nose: No congestion/rhinnorhea.      Mouth/Throat: Mucous membranes are moist.  Neck: No stridor.  No cervical spine tenderness to palpation.  Cardiovascular: Normal rate, regular rhythm. Normal S1 and S2.  Good peripheral circulation. Respiratory: Normal respiratory effort without tachypnea or retractions. Lungs CTAB. Good air entry to the bases with no decreased or absent breath sounds Gastrointestinal: Bowel sounds x 4 quadrants.  Patient has inguinal  tenderness to palpation.  No palpable bulge.  No guarding or rigidity. No distention. Musculoskeletal: Patient has symmetric strength in the lower extremities.  No pain with internal and external rotation of the right hip. Neurologic:  Normal for age. No gross focal neurologic deficits are appreciated.  Skin:  Skin is warm, dry and intact. No rash noted. Psychiatric: Mood and affect are normal for age. Speech and behavior are normal.   ____________________________________________   LABS (all labs ordered are listed, but only abnormal results are displayed)  Labs Reviewed  COMPREHENSIVE METABOLIC PANEL - Abnormal; Notable for the following components:      Result Value   Glucose, Bld 101 (*)    All other components within normal limits  URINALYSIS, COMPLETE (UACMP) WITH MICROSCOPIC - Abnormal; Notable for the following components:   Color, Urine YELLOW (*)    APPearance HAZY (*)    Bacteria, UA RARE (*)    All other components within normal limits  CBC WITH DIFFERENTIAL/PLATELET  LIPASE, BLOOD   ____________________________________________  EKG   ____________________________________________  RADIOLOGY Geraldo Pitter, personally viewed and evaluated these images (plain radiographs) as part of my medical decision making, as well as reviewing the written report by the radiologist.    CT ABDOMEN PELVIS W CONTRAST  Result Date: 06/30/2021 CLINICAL DATA:  Abdominal pain EXAM: CT ABDOMEN AND PELVIS WITH CONTRAST TECHNIQUE: Multidetector CT imaging of the abdomen and pelvis was performed using the standard protocol following bolus administration of intravenous contrast. CONTRAST:  53mL OMNIPAQUE IOHEXOL 300 MG/ML  SOLN COMPARISON:  None. FINDINGS: LOWER CHEST: Normal. HEPATOBILIARY: Normal hepatic contours. No intra- or extrahepatic biliary dilatation. Status post cholecystectomy. PANCREAS: Normal pancreas. No ductal dilatation or peripancreatic fluid collection. SPLEEN: Normal.  ADRENALS/URINARY TRACT: The adrenal glands are normal. No hydronephrosis, nephroureterolithiasis or solid renal mass. Pelvic location of the left kidney. The urinary bladder is normal for degree of distention STOMACH/BOWEL: There is no hiatal hernia. Normal duodenal course and caliber. No small bowel dilatation or inflammation. No focal colonic abnormality. Status post appendectomy. VASCULAR/LYMPHATIC: There is calcific atherosclerosis of the abdominal aorta. No lymphadenopathy. REPRODUCTIVE: Status post hysterectomy. No adnexal mass. MUSCULOSKELETAL. No bony spinal canal stenosis or focal osseous abnormality. OTHER: None. IMPRESSION: 1. No acute abnormality of the abdomen or pelvis. 2. Pelvic location of the left kidney. Aortic Atherosclerosis (ICD10-I70.0). Electronically Signed   By: Deatra Robinson M.D.   On: 06/30/2021 18:56    ____________________________________________    PROCEDURES  Procedure(s) performed:     Procedures     Medications  iohexol (OMNIPAQUE) 300 MG/ML solution 75 mL (75 mLs Intravenous Contrast Given 06/30/21 1812)  ketorolac (TORADOL) 30 MG/ML injection 30 mg (30  mg Intramuscular Given 06/30/21 1915)     ____________________________________________   INITIAL IMPRESSION / ASSESSMENT AND PLAN / ED COURSE  Pertinent labs & imaging results that were available during my care of the patient were reviewed by me and considered in my medical decision making (see chart for details).      Assessment and Plan:  Groin pain:  55 year old female presents to the emergency department with concern for right-sided groin pain.  Patient was hypertensive at triage but vital signs were otherwise reassuring.  No acute abnormality on CT abdomen and pelvis.  Patient was given an injection of Toradol and discharged with meloxicam.  Return precautions were given to return with new or worsening symptoms.    ____________________________________________  FINAL CLINICAL IMPRESSION(S)  / ED DIAGNOSES  Final diagnoses:  Right inguinal pain      NEW MEDICATIONS STARTED DURING THIS VISIT:  ED Discharge Orders          Ordered    meloxicam (MOBIC) 15 MG tablet  Daily        06/30/21 1906                This chart was dictated using voice recognition software/Dragon. Despite best efforts to proofread, errors can occur which can change the meaning. Any change was purely unintentional.     Orvil Feil, PA-C 06/30/21 1942    Delton Prairie, MD 07/01/21 [phone]

## 2021-06-30 NOTE — ED Notes (Signed)
See triage note  Presents with pain to right groin area  States she fell about 2 weeks ago    Has been having pain to right hip area  But now states pain has increased in groin  Also having a h/a  No fever

## 2021-06-30 NOTE — ED Triage Notes (Signed)
[name] about one week ago, c/o right groin pain.  Ambulates with easy and steady gait. NAD

## 2021-06-30 NOTE — Discharge Instructions (Addendum)
Take Meloxicam once daily for pain and inflammation.  

## 2021-08-21 ENCOUNTER — Encounter (HOSPITAL_COMMUNITY): Payer: Self-pay | Admitting: *Deleted

## 2021-08-21 ENCOUNTER — Emergency Department (HOSPITAL_COMMUNITY): Payer: Self-pay

## 2021-08-21 ENCOUNTER — Other Ambulatory Visit: Payer: Self-pay

## 2021-08-21 ENCOUNTER — Emergency Department (HOSPITAL_COMMUNITY)
Admission: EM | Admit: 2021-08-21 | Discharge: 2021-08-21 | Disposition: A | Discharge status: Home / Self Care | Payer: Self-pay | Attending: Emergency Medicine | Admitting: Emergency Medicine

## 2021-08-21 DIAGNOSIS — J449 Chronic obstructive pulmonary disease, unspecified: Secondary | ICD-10-CM | POA: Insufficient documentation

## 2021-08-21 DIAGNOSIS — I10 Essential (primary) hypertension: Secondary | ICD-10-CM | POA: Insufficient documentation

## 2021-08-21 DIAGNOSIS — M5431 Sciatica, right side: Secondary | ICD-10-CM | POA: Insufficient documentation

## 2021-08-21 DIAGNOSIS — E119 Type 2 diabetes mellitus without complications: Secondary | ICD-10-CM | POA: Insufficient documentation

## 2021-08-21 DIAGNOSIS — F17210 Nicotine dependence, cigarettes, uncomplicated: Secondary | ICD-10-CM | POA: Insufficient documentation

## 2021-08-21 DIAGNOSIS — M79661 Pain in right lower leg: Secondary | ICD-10-CM | POA: Insufficient documentation

## 2021-08-21 DIAGNOSIS — Z79899 Other long term (current) drug therapy: Secondary | ICD-10-CM | POA: Insufficient documentation

## 2021-08-21 MED ORDER — TRAMADOL HCL 50 MG PO TABS: 50.0000 mg | ORAL_TABLET | Freq: Four times a day (QID) | ORAL | 0 refills | Status: AC | PRN

## 2021-08-21 MED ORDER — KETOROLAC TROMETHAMINE 60 MG/2ML IM SOLN
60.0000 mg | Freq: Once | INTRAMUSCULAR | Status: AC
  Administered 2021-08-21: 60 mg via INTRAMUSCULAR
  Filled 2021-08-21: qty 2

## 2021-08-21 MED ORDER — METHOCARBAMOL 500 MG PO TABS: 500.0000 mg | ORAL_TABLET | Freq: Three times a day (TID) | ORAL | 0 refills | Status: DC

## 2021-08-21 MED ORDER — PREDNISONE 10 MG PO TABS: ORAL_TABLET | ORAL | 0 refills | Status: DC

## 2021-08-21 NOTE — ED Provider Notes (Signed)
[facility] EMERGENCY DEPARTMENT Provider Note   CSN: [id number] Arrival date & time: 08/21/21  1549     History Chief Complaint  Patient presents with   Leg Pain    Connie Hernandez is a 55 y.o. female.   Leg Pain Associated symptoms: back pain   Associated symptoms: no fatigue, no fever and no neck pain        Connie Hernandez is a 55 y.o. female with past medical history of type 2 diabetes, hypertension, COPD, and hyperlipidemia who presents to the Emergency Department complaining of radiating pain of her right lower back and leg that has been waxing and waning for some time.  She notes noticing swelling of her right lower leg x3 days.  States the swelling worsens throughout the day and is somewhat better upon waking in the morning.  She describes an aching pain from her right lower back that radiates into her leg down to the level of her foot.  Pain is worsened with certain movements and positions.  Improves somewhat at rest.  She has been taking Tylenol with minimal relief.  States the swelling of her leg is new and what has prompted her to seek evaluation in the emergency department.  She denies any chest pain, shortness of breath, history of DVT or PE.  No recent injury.    Past Medical History:  Diagnosis Date   COPD (chronic obstructive pulmonary disease) (HCC)    Diabetes mellitus without complication (HCC)    Hyperlipemia    Hypertension     There are no problems to display for this patient.   Past Surgical History:  Procedure Laterality Date   APPENDECTOMY     CHOLECYSTECTOMY     FOOT SURGERY     HAND SURGERY     INGUINAL HERNIA REPAIR     KNEE SURGERY     NECK SURGERY     TONSILLECTOMY       OB History   No obstetric history on file.     No family history on file.  Social History   Tobacco Use   Smoking status: Every Day    Packs/day: 0.50    Types: Cigarettes   Smokeless tobacco: Never  Vaping Use   Vaping Use: Former  Substance Use  Topics   Alcohol use: No   Drug use: No    Home Medications Prior to Admission medications   Medication Sig Start Date End Date Taking? Authorizing Provider  amLODipine (NORVASC) 10 MG tablet Take 1 tablet (10 mg total) by mouth daily. 03/10/21 06/08/21  Menshew, Charlesetta Ivory, PA-C  propranolol (INDERAL) 20 MG tablet Take 1 tablet (20 mg total) by mouth 2 (two) times daily. 03/10/21 06/08/21  Menshew, Charlesetta Ivory, PA-C    Allergies    Dilaudid [hydromorphone hcl], Vicodin [hydrocodone-acetaminophen], and Wellbutrin [bupropion]  Review of Systems   Review of Systems  Constitutional:  Negative for chills, fatigue and fever.  Respiratory:  Negative for shortness of breath.   Cardiovascular:  Positive for leg swelling (right lower leg swelling). Negative for chest pain and palpitations.  Gastrointestinal:  Negative for abdominal pain, nausea and vomiting.  Genitourinary:  Negative for difficulty urinating, dysuria and flank pain.  Musculoskeletal:  Positive for back pain. Negative for arthralgias, myalgias (right lower leg pain and swelling), neck pain and neck stiffness.  Skin:  Negative for color change, rash and wound.  Neurological:  Negative for dizziness, weakness and numbness.  Hematological:  Does not bruise/bleed easily.  Physical Exam Updated Vital Signs BP 134/83 (BP Location: Right Arm)   Pulse 87   Temp 98.1 F (36.7 C) (Oral)   Resp 17   Ht 5\' 1"  (1.549 m)   Wt 73.9 kg   SpO2 98%   BMI 30.78 kg/m   Physical Exam Vitals and nursing note reviewed.  Constitutional:      General: She is not in acute distress.    Appearance: Normal appearance. She is not ill-appearing or toxic-appearing.  Cardiovascular:     Rate and Rhythm: Normal rate and regular rhythm.     Pulses: Normal pulses.  Pulmonary:     Effort: Pulmonary effort is normal.     Breath sounds: Normal breath sounds. No wheezing.  Abdominal:     Palpations: Abdomen is soft.     Tenderness: There is  no abdominal tenderness. There is no guarding or rebound.  Musculoskeletal:        General: Tenderness present. No signs of injury. Normal range of motion.     Right lower leg: No edema.     Left lower leg: No edema.     Comments: Tender to palpation along the right lowe lumbar paraspinal muscles and right SI joint space.  Pain reproduced on straight leg raise on the right.  Patient endorses edema of the dorsal right foot although I do not appreciate this on exam.  No erythema or excessive warmth of the lower extremity.  Skin:    General: Skin is warm.     Capillary Refill: Capillary refill takes less than 2 seconds.     Findings: No bruising, erythema or rash.  Neurological:     General: No focal deficit present.     Mental Status: She is alert and oriented to person, place, and time.     Sensory: No sensory deficit.     Motor: No weakness.    ED Results / Procedures / Treatments   Labs (all labs ordered are listed, but only abnormal results are displayed) Labs Reviewed - No data to display  EKG None  Radiology DG Lumbar Spine Complete  Result Date: 08/21/2021 CLINICAL DATA:  Low back pain radiating down right leg. EXAM: LUMBAR SPINE - COMPLETE 4+ VIEW COMPARISON:  Lumbar radiograph 07/16/2020 FINDINGS: L5 is hemi transitional with enlarged right transverse process and pseudoarticulation with the sacrum. Normal alignment. Disc space narrowing and endplate spurring at L1-L2 is similar to prior exam. Minor endplate spurring at L4-L5. Scattered facet hypertrophy. Vertebral body heights are normal. No evidence of fracture or focal bone abnormality. Sacroiliac joints are congruent. IMPRESSION: 1. No acute abnormality. 2. 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 Scattered facet hypertrophy. Electronically Signed   By: 09/15/2020 M.D.   On: 08/21/2021 18:39   10/21/2021 Venous Img Lower Unilateral Right  Result Date: 08/21/2021 CLINICAL DATA:  leg pain, swelling EXAM: RIGHT  LOWER EXTREMITY VENOUS DOPPLER ULTRASOUND TECHNIQUE: Gray-scale sonography with compression, as well as color and duplex ultrasound, were performed to evaluate the deep venous system(s) from the level of the common femoral vein through the popliteal and proximal calf veins. COMPARISON:  None. FINDINGS: VENOUS Normal compressibility of the common femoral, superficial femoral, and popliteal veins, as well as the visualized calf veins. Visualized portions of profunda femoral vein and great saphenous vein unremarkable. No filling defects to suggest DVT on grayscale or color Doppler imaging. Doppler waveforms show normal direction of venous flow, normal respiratory plasticity and response to augmentation. Limited views of the  contralateral common femoral vein are unremarkable. IMPRESSION: No evidence of DVT in the right lower extremity. Electronically Signed   By: Feliberto Harts M.D.   On: 08/21/2021 17:50    Procedures Procedures   Medications Ordered in ED Medications - No data to display  ED Course  I have reviewed the triage vital signs and the nursing notes.  Pertinent labs & imaging results that were available during my care of the patient were reviewed by me and considered in my medical decision making (see chart for details).    MDM Rules/Calculators/A&P                           Patient here for evaluation of recurrent low back and right leg pain.  This is been a reoccurring problem for her for some time.  She endorses having swelling of her right lower leg that is new x3 days.  No known injury.  No history of PE or DVT.  She denies chest pain or shortness of breath  On my exam patient has positive straight leg raise on the right.  No appreciable edema, erythema or excessive warmth of the lower extremity.  Strong palpable DP and posterior pulses bilaterally.  Clinically, I suspect this is related to sciatica but given that patient reports new edema we will proceed with venous imaging of  the right lower extremity.  On recheck, she is resting comfortably.  [place] study neg for DVT.  Sx's felt to be related to sciatica.  No clinical concern for cellulitis.  No red flags to suggest cauda equina. Pt is ambulatory and gait steady.  Appears appropriate for d/c, will f/u with PCP.  Given return precautions  Final Clinical Impression(s) / ED Diagnoses Final diagnoses:  Sciatica of right side    Rx / DC Orders ED Discharge Orders     None        Pauline Aus, PA-C 08/22/21 1439    Terrilee Files, MD 08/23/21 1601

## 2021-08-21 NOTE — ED Triage Notes (Signed)
Right leg pain x 3 days

## 2021-08-21 NOTE — Discharge Instructions (Addendum)
The ultrasound of your right lower leg today did not show evidence of a blood clot.  Your symptoms are likely coming from your lower back.  Take the prescribed medications as directed.  You may alternate ice and heat to your lower back and hip area.  I have listed to local clinics for you to establish primary care if needed.  You may also contact Dr. Mort Sawyers office to arrange a follow-up appointment if your back pain is not improving.

## 2021-09-01 ENCOUNTER — Encounter (HOSPITAL_COMMUNITY): Payer: Self-pay | Admitting: *Deleted

## 2021-09-01 ENCOUNTER — Other Ambulatory Visit: Payer: Self-pay

## 2021-09-01 ENCOUNTER — Emergency Department (HOSPITAL_COMMUNITY)
Admission: EM | Admit: 2021-09-01 | Discharge: 2021-09-01 | Disposition: A | Discharge status: Home / Self Care | Payer: Self-pay | Attending: Emergency Medicine | Admitting: Emergency Medicine

## 2021-09-01 DIAGNOSIS — E119 Type 2 diabetes mellitus without complications: Secondary | ICD-10-CM | POA: Insufficient documentation

## 2021-09-01 DIAGNOSIS — F17210 Nicotine dependence, cigarettes, uncomplicated: Secondary | ICD-10-CM | POA: Insufficient documentation

## 2021-09-01 DIAGNOSIS — Z760 Encounter for issue of repeat prescription: Secondary | ICD-10-CM | POA: Insufficient documentation

## 2021-09-01 DIAGNOSIS — I10 Essential (primary) hypertension: Secondary | ICD-10-CM | POA: Insufficient documentation

## 2021-09-01 DIAGNOSIS — J449 Chronic obstructive pulmonary disease, unspecified: Secondary | ICD-10-CM | POA: Insufficient documentation

## 2021-09-01 DIAGNOSIS — Z79899 Other long term (current) drug therapy: Secondary | ICD-10-CM | POA: Insufficient documentation

## 2021-09-01 MED ORDER — AMLODIPINE BESYLATE 10 MG PO TABS: 10.0000 mg | ORAL_TABLET | Freq: Every day | ORAL | 0 refills | Status: AC

## 2021-09-01 MED ORDER — PROPRANOLOL HCL 20 MG PO TABS: 20.0000 mg | ORAL_TABLET | Freq: Two times a day (BID) | ORAL | 0 refills | Status: AC

## 2021-09-01 NOTE — Discharge Instructions (Addendum)
Please follow-up with the PCP for further evaluation.  Come back to the emergency department if you develop chest pain, shortness of breath, severe abdominal pain, uncontrolled nausea, vomiting, diarrhea.

## 2021-09-01 NOTE — ED Triage Notes (Signed)
Out of blood pressure medication for the past 3 days

## 2021-09-01 NOTE — ED Provider Notes (Signed)
[facility] EMERGENCY DEPARTMENT Provider Note   CSN: [id number] Arrival date & time: 09/01/21  1044     History Chief Complaint  Patient presents with   Medication Refill    Connie Hernandez is a 55 y.o. female.  HPI  Patient with significant medical history of COPD, diabetes, hyperlipidemia hypertension presents with chief complaint of medication refill.  Patient states that she has been without her medication for the last 4 days, she states she takes propranolol as well as amlodipine.  She has no other complaints at this time.  Does not endorse fevers, chills, chest pain, shortness of breath, abdominal pain, nausea, vomit, diarrhea.  She states that she recent moved here and is looking for a primary care provider to refill her medications.  Past Medical History:  Diagnosis Date   COPD (chronic obstructive pulmonary disease) (HCC)    Diabetes mellitus without complication (HCC)    Hyperlipemia    Hypertension     There are no problems to display for this patient.   Past Surgical History:  Procedure Laterality Date   APPENDECTOMY     CHOLECYSTECTOMY     FOOT SURGERY     HAND SURGERY     INGUINAL HERNIA REPAIR     KNEE SURGERY     NECK SURGERY     TONSILLECTOMY       OB History   No obstetric history on file.     No family history on file.  Social History   Tobacco Use   Smoking status: Every Day    Packs/day: 0.50    Types: Cigarettes   Smokeless tobacco: Never  Vaping Use   Vaping Use: Former  Substance Use Topics   Alcohol use: No   Drug use: No    Home Medications Prior to Admission medications   Medication Sig Start Date End Date Taking? Authorizing Provider  amLODipine (NORVASC) 10 MG tablet Take 1 tablet (10 mg total) by mouth daily. 09/01/21 10/01/21 Yes Carroll Sage, PA-C  propranolol (INDERAL) 20 MG tablet Take 1 tablet (20 mg total) by mouth 2 (two) times daily. 09/01/21 10/01/21 Yes Carroll Sage, PA-C  amLODipine (NORVASC)  10 MG tablet Take 1 tablet (10 mg total) by mouth daily. 03/10/21 06/08/21  Menshew, Charlesetta Ivory, PA-C  methocarbamol (ROBAXIN) 500 MG tablet Take 1 tablet (500 mg total) by mouth 3 (three) times daily. 08/21/21   Triplett, Tammy, PA-C  predniSONE (DELTASONE) 10 MG tablet Take 6 tablets day one, 5 tablets day two, 4 tablets day three, 3 tablets day four, 2 tablets day five, then 1 tablet day six 08/21/21   Triplett, Tammy, PA-C  propranolol (INDERAL) 20 MG tablet Take 1 tablet (20 mg total) by mouth 2 (two) times daily. 03/10/21 06/08/21  Menshew, Charlesetta Ivory, PA-C  traMADol (ULTRAM) 50 MG tablet Take 1 tablet (50 mg total) by mouth every 6 (six) hours as needed. 08/21/21   Triplett, Tammy, PA-C    Allergies    Dilaudid [hydromorphone hcl], Vicodin [hydrocodone-acetaminophen], and Wellbutrin [bupropion]  Review of Systems   Review of Systems  Constitutional:  Negative for chills and fever.  HENT:  Negative for congestion.   Respiratory:  Negative for shortness of breath.   Cardiovascular:  Negative for chest pain.  Gastrointestinal:  Negative for abdominal pain.  Genitourinary:  Negative for enuresis.  Musculoskeletal:  Negative for back pain.  Skin:  Negative for rash.  Neurological:  Negative for dizziness.  Hematological:  Does not bruise/bleed  easily.   Physical Exam Updated Vital Signs BP (!) 155/87 (BP Location: Right Arm)   Pulse 71   Temp 98 F (36.7 C)   Resp 20   SpO2 100%   Physical Exam Vitals and nursing note reviewed.  Constitutional:      General: She is not in acute distress.    Appearance: Normal appearance. She is not ill-appearing or diaphoretic.  HENT:     Head: Normocephalic and atraumatic.     Nose: No congestion or rhinorrhea.  Eyes:     General: No scleral icterus.       Right eye: No discharge.        Left eye: No discharge.     Conjunctiva/sclera: Conjunctivae normal.  Pulmonary:     Effort: Pulmonary effort is normal.  Musculoskeletal:         General: Normal range of motion.     Cervical back: Neck supple.  Skin:    General: Skin is warm and dry.  Neurological:     Mental Status: She is alert.  Psychiatric:        Mood and Affect: Mood normal.    ED Results / Procedures / Treatments   Labs (all labs ordered are listed, but only abnormal results are displayed) Labs Reviewed - No data to display  EKG None  Radiology No results found.  Procedures Procedures   Medications Ordered in ED Medications - No data to display  ED Course  I have reviewed the triage vital signs and the nursing notes.  Pertinent labs & imaging results that were available during my care of the patient were reviewed by me and considered in my medical decision making (see chart for details).    MDM Rules/Calculators/A&P                          Initial impression-patient presents with chief complaint of medication refill.  She is alert, does not appear in acute stress, vital signs reassuring.  Work-up-due to well-appearing patient, benign physical exam, further lab work and imaging are not warranted at this time.  Rule out-low suspicion for systemic infection as patient is nontoxic-appearing, vital signs reassuring.  Low suspicion for ACS as patient has chest pain, shortness of breath, worsening pedal edema.  Low suspicion for intra-abdominal abnormality as patient denies abdominal pain, nausea, vomiting.  Low suspicion for hypertensive emergency or urgency blood pressures within normal limits.  Plan-  Medication refill-will represcribe patient's antihypertensives, have her follow-up with a PCP for further evaluation.  Vital signs have remained stable, no indication for hospital admission.  Patient discussed with attending and they agreed with assessment and plan.  Patient given at home care as well strict return precautions.  Patient verbalized that they understood agreed to said plan.  Final Clinical Impression(s) / ED Diagnoses Final  diagnoses:  Medication refill    Rx / DC Orders ED Discharge Orders          Ordered    amLODipine (NORVASC) 10 MG tablet  Daily        09/01/21 1354    propranolol (INDERAL) 20 MG tablet  2 times daily        09/01/21 1354             Barnie Del 09/01/21 1354    Pollyann Savoy, MD 09/01/21 [phone]

## 2022-02-03 IMAGING — DX DG LUMBAR SPINE COMPLETE 4+V
5 series · 5 of 5 positions shown · non-contrast
Comparison: Lumbar radiograph 07/16/2020

CLINICAL DATA: Low back pain radiating down right leg.

EXAM:
LUMBAR SPINE - COMPLETE 4+ VIEW

[l-spine ap]
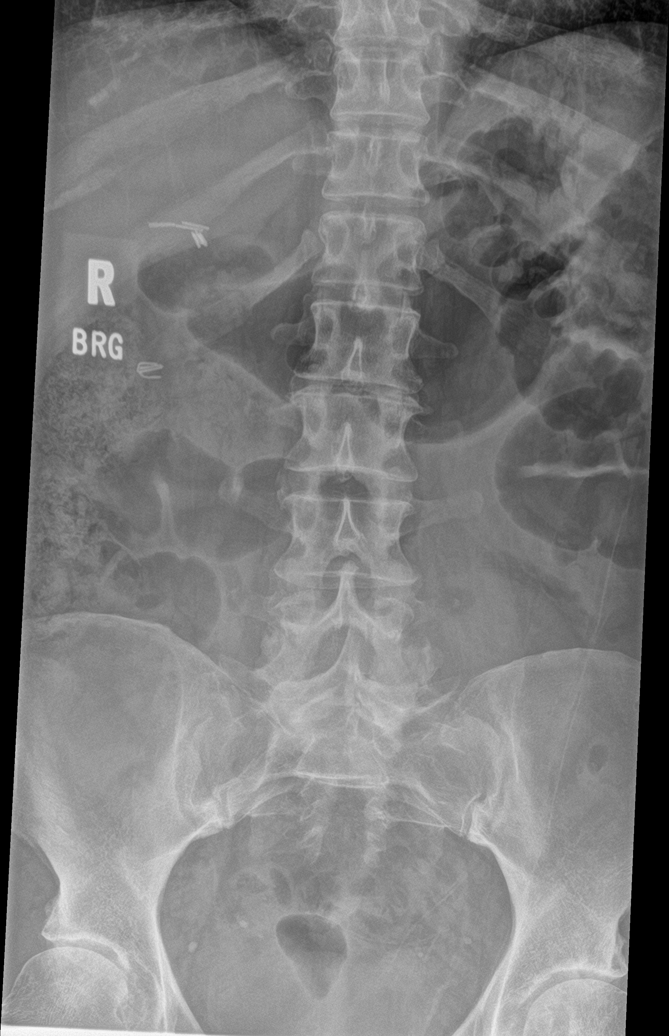

[l-spine obl (1 of 2)]
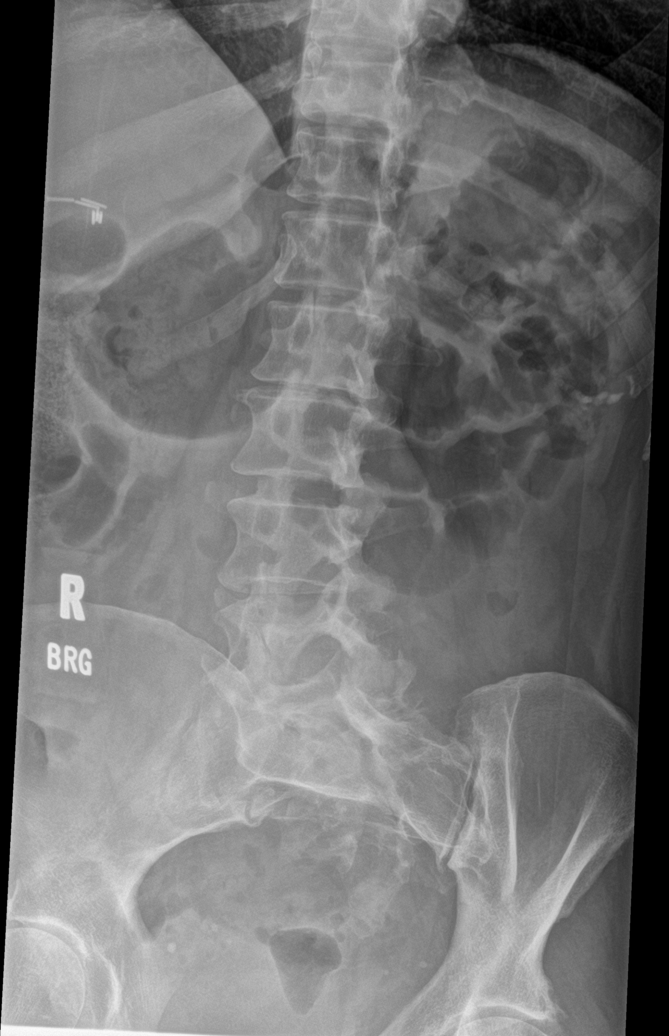

[l-spine obl (2 of 2)]
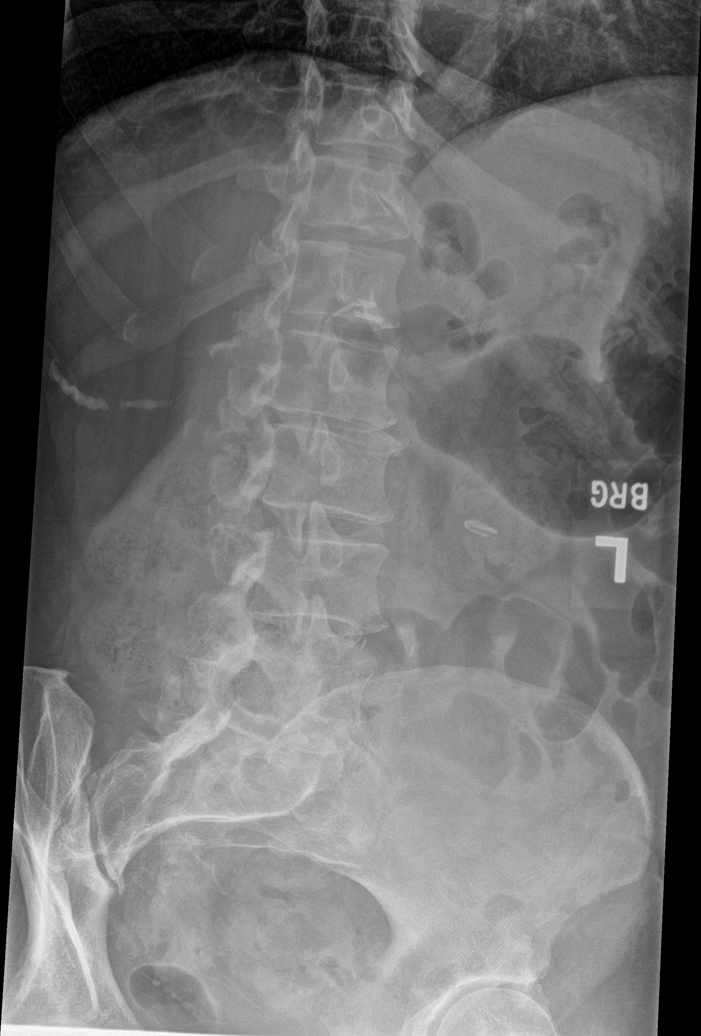

[l-spine lat]
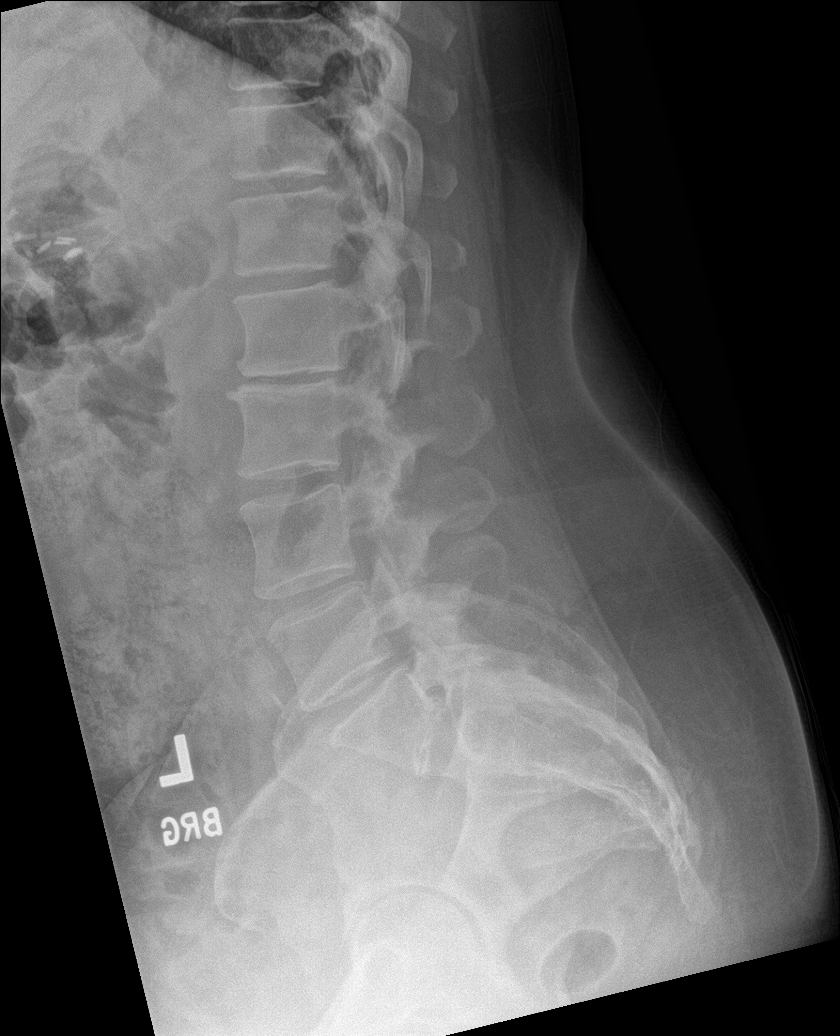

[l-spine spot]
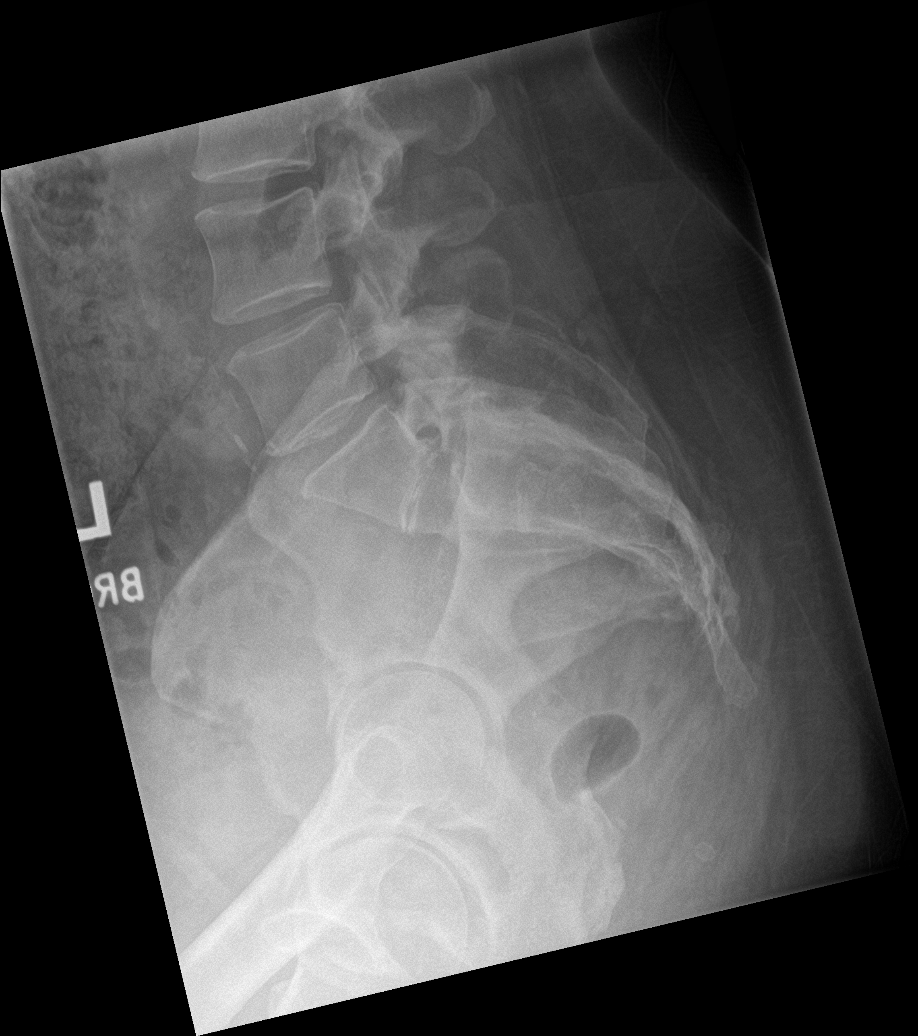

[5 of 5 positions shown; findings below may reference images not displayed]

FINDINGS: L5 is hemi transitional with enlarged right transverse process and
pseudoarticulation with the sacrum. Normal alignment. Disc space
narrowing and endplate spurring at L1-L2 is similar to prior exam.
Minor endplate spurring at L4-L5. Scattered facet hypertrophy.
Vertebral body heights are normal. No evidence of fracture or focal
bone abnormality. Sacroiliac joints are congruent.
IMPRESSION: 1. No acute abnormality.
2. Degenerative disc disease at L1-L2, similar to prior exam.
Scattered facet hypertrophy.

## 2022-02-17 ENCOUNTER — Other Ambulatory Visit: Payer: Self-pay

## 2022-02-17 ENCOUNTER — Emergency Department: Payer: Medicaid Other

## 2022-02-17 ENCOUNTER — Emergency Department
Admission: EM | Admit: 2022-02-17 | Discharge: 2022-02-17 | Disposition: A | Discharge status: Home / Self Care | Payer: Medicaid Other | Attending: Emergency Medicine | Admitting: Emergency Medicine

## 2022-02-17 DIAGNOSIS — M5441 Lumbago with sciatica, right side: Secondary | ICD-10-CM | POA: Insufficient documentation

## 2022-02-17 DIAGNOSIS — I10 Essential (primary) hypertension: Secondary | ICD-10-CM | POA: Diagnosis not present

## 2022-02-17 DIAGNOSIS — J449 Chronic obstructive pulmonary disease, unspecified: Secondary | ICD-10-CM | POA: Insufficient documentation

## 2022-02-17 DIAGNOSIS — E119 Type 2 diabetes mellitus without complications: Secondary | ICD-10-CM | POA: Diagnosis not present

## 2022-02-17 DIAGNOSIS — M5431 Sciatica, right side: Secondary | ICD-10-CM

## 2022-02-17 DIAGNOSIS — M549 Dorsalgia, unspecified: Secondary | ICD-10-CM | POA: Diagnosis present

## 2022-02-17 DIAGNOSIS — M25551 Pain in right hip: Secondary | ICD-10-CM

## 2022-02-17 MED ORDER — PREDNISONE 50 MG PO TABS: 50.0000 mg | ORAL_TABLET | Freq: Every day | ORAL | 0 refills | Status: AC

## 2022-02-17 MED ORDER — METHOCARBAMOL 500 MG PO TABS: 500.0000 mg | ORAL_TABLET | Freq: Four times a day (QID) | ORAL | 0 refills | Status: AC

## 2022-02-17 NOTE — Discharge Instructions (Addendum)
-  Take all of your medications as prescribed. ?-Follow-up with your primary care provider, as well as the orthopedist listed above as discussed. ?-Return to the emergency department anytime if you begin to experience any new or worsening symptoms ?

## 2022-02-17 NOTE — ED Triage Notes (Signed)
Pt c/o chronic BL hip and lower back pain but recently having increased pain to the right hip.Marland Kitchen ?

## 2022-02-17 NOTE — ED Provider Notes (Signed)
? ?[facility] ?Provider Note ? ? ? Event Date/Time  ? First MD Initiated Contact with Patient 02/17/22 1456   ?  (approximate) ? ? ?History  ? ?Chief Complaint ?Hip Pain ? ? ?HPI ?Connie Hernandez is a 56 y.o. female, history of COPD, hyperlipidemia, diabetes, hypertension, presents the emergency department for evaluation of back pain/hip pain.  Patient states that she has a history of chronic back/hip pain bilaterally, however it has recently worsened over the past few weeks.  Reports a knot-like sensation along the posterior aspect of her thigh near her gluteus.  Patient is able to ambulate without issue.  Denies fever/chills, numbness/tingling in upper or lower extremities, cough, congestion, headache, neck pain, chest pain, shortness of breath, or abdominal pain.  ? ?History Limitations: No limitations ? ?  ? ? ?Physical Exam  ?Triage Vital Signs: ?ED Triage Vitals  ?Enc Vitals Group  ?   BP 02/17/22 1421 (!) 151/93  ?   Pulse Rate 02/17/22 1421 76  ?   Resp 02/17/22 1421 18  ?   Temp 02/17/22 1421 97.8 ?F (36.6 ?C)  ?   Temp Source 02/17/22 1421 Oral  ?   SpO2 02/17/22 1421 100 %  ?   Weight 02/17/22 1422 165 lb (74.8 kg)  ?   Height 02/17/22 1422 5\' 1"  (1.549 m)  ?   Head Circumference --   ?   Peak Flow --   ?   Pain Score 02/17/22 1421 8  ?   Pain Loc --   ?   Pain Edu? --   ?   Excl. in GC? --   ? ? ?Most recent vital signs: ?Vitals:  ? 02/17/22 1421  ?BP: (!) 151/93  ?Pulse: 76  ?Resp: 18  ?Temp: 97.8 ?F (36.6 ?C)  ?SpO2: 100%  ? ? ?General: Awake, NAD.  ?CV: Good peripheral perfusion.  ?Resp: Normal effort.  ?Abd: Soft, non-tender. No distention.  ?Neuro: At baseline. No gross neurological deficits. ?Other: No gross deformities.  No midline spinal tenderness pulse, motor, sensation intact distally in the right lower extremity.  Positive straight leg test.  No warmth, tenderness, or erythema around the reported site of pain.  Patient is able to ambulate across room without  difficulty.  Normal flexion extension at the knee joint ? ?Physical Exam ? ? ? ?ED Results / Procedures / Treatments  ?Labs ?(all labs ordered are listed, but only abnormal results are displayed) ?Labs Reviewed - No data to display ? ? ?EKG ?Not applicable ? ? ?RADIOLOGY ? ?ED Provider Interpretation: I personally reviewed these images.  No evidence of acute fractures on lumbar x-ray or hip x-ray ? ?DG Lumbar Spine Complete ? ?Result Date: 02/17/2022 ?CLINICAL DATA:  Chronic low back pain. EXAM: LUMBAR SPINE - COMPLETE 4+ VIEW COMPARISON:  Lumbar spine x-rays dated August 21, 2021. FINDINGS: Unchanged partial sacralization of L5. No acute fracture or subluxation. Vertebral body heights are preserved. Alignment is normal. Unchanged moderate disc height loss at L1-L2. Unchanged moderate bilateral facet arthropathy at L4-L5. Unchanged mild degenerative changes of the sacroiliac joints. IMPRESSION: 1. Unchanged moderate degenerative disc disease at L1-L2. Electronically Signed   By: August 23, 2021 M.D.   On: 02/17/2022 15:56  ? ?DG Hip Unilat With Pelvis 2-3 Views Right ? ?Result Date: 02/17/2022 ?CLINICAL DATA:  Chronic right hip pain. EXAM: DG HIP (WITH OR WITHOUT PELVIS) 2-3V RIGHT COMPARISON:  CT abdomen pelvis dated June 30, 2021. FINDINGS: No acute fracture or dislocation. Unchanged mild  bilateral hip joint space narrowing with small marginal osteophytes. Unchanged subchondral cyst in the left femoral head. Bone mineralization is normal. Soft tissues are unremarkable. IMPRESSION: 1. Unchanged mild bilateral hip osteoarthritis. Electronically Signed   By: Obie Dredge M.D.   On: 02/17/2022 15:54   ? ?PROCEDURES: ? ?Critical Care performed: None. ? ?Procedures ? ? ? ?MEDICATIONS ORDERED IN ED: ?Medications - No data to display ? ? ?IMPRESSION / MDM / ASSESSMENT AND PLAN / ED COURSE  ?I reviewed the triage vital signs and the nursing notes. ?             ?               ? ?Differential diagnosis includes, but is  not limited to, sciatica, lumbosacral sprain, avascular necrosis, bursitis, piriformis syndrome, fibromyalgia ? ? ? ?ED Course ?Patient appears well.  Vital signs within normal limits.  NAD ? ?X-ray of lumbar spine and hip showed no evidence of acute injuries.  See above for more details. ? ?Assessment/Plan ?Given the patient's history, physical exam, and work-up, I do not suspect any serious or life-threatening pathology.  X-rays reassuring for no acute fractures.  Presentation consistent with chronic osteoarthritis in the hips and sciatica.  We will plan to discharge this patient with a prescription for methocarbamol and prednisone.  Advised her to follow-up with her primary care provider as needed. We will provide her a referral to orthopedics. ? ?Patient was provided with anticipatory guidance, return precautions, and educational material. Encouraged the patient to return to the emergency department at any time if they begin to experience any new or worsening symptoms.  ? ?  ? ? ?FINAL CLINICAL IMPRESSION(S) / ED DIAGNOSES  ? ?Final diagnoses:  ?Right hip pain  ?Sciatica of right side  ? ? ? ?Rx / DC Orders  ? ?ED Discharge Orders   ? ?      Ordered  ?  predniSONE (DELTASONE) 50 MG tablet  Daily with breakfast       ? 02/17/22 1629  ?  methocarbamol (ROBAXIN) 500 MG tablet  4 times daily       ? 02/17/22 1629  ? ?  ?  ? ?  ? ? ? ?Note:  This document was prepared using Dragon voice recognition software and may include unintentional dictation errors. ?  ?Varney Daily, [place] ?02/17/22 1809 ? ?  ?Merwyn Katos, MD ?02/18/22 1624 ? ?

## 2022-02-19 ENCOUNTER — Emergency Department
Admission: EM | Admit: 2022-02-19 | Discharge: 2022-02-19 | Disposition: A | Discharge status: Home / Self Care | Payer: Medicaid Other | Attending: Emergency Medicine | Admitting: Emergency Medicine

## 2022-02-19 ENCOUNTER — Other Ambulatory Visit: Payer: Self-pay

## 2022-02-19 DIAGNOSIS — R519 Headache, unspecified: Secondary | ICD-10-CM | POA: Diagnosis present

## 2022-02-19 DIAGNOSIS — R112 Nausea with vomiting, unspecified: Secondary | ICD-10-CM

## 2022-02-19 LAB — CBC
HCT: 42.6 % (ref 36.0–46.0)
Hemoglobin: 14.1 g/dL (ref 12.0–15.0)
MCH: 29.5 pg (ref 26.0–34.0)
MCHC: 33.1 g/dL (ref 30.0–36.0)
MCV: 89.1 fL (ref 80.0–100.0)
Platelets: 348 10*3/uL (ref 150–400)
RBC: 4.78 MIL/uL (ref 3.87–5.11)
RDW: 12.7 % (ref 11.5–15.5)
WBC: 9.4 10*3/uL (ref 4.0–10.5)
nRBC: 0 % (ref 0.0–0.2)

## 2022-02-19 LAB — COMPREHENSIVE METABOLIC PANEL
ALT: 14 U/L (ref 0–44)
AST: 17 U/L (ref 15–41)
Albumin: 4.4 g/dL (ref 3.5–5.0)
Alkaline Phosphatase: 85 U/L (ref 38–126)
Anion gap: 10 (ref 5–15)
BUN: 18 mg/dL (ref 6–20)
CO2: 27 mmol/L (ref 22–32)
Calcium: 9.3 mg/dL (ref 8.9–10.3)
Chloride: 98 mmol/L (ref 98–111)
Creatinine, Ser: 0.71 mg/dL (ref 0.44–1.00)
GFR, Estimated: >60 mL/min (ref >60)
Glucose, Bld: 121 mg/dL — ABNORMAL HIGH (ref 70–99)
Potassium: 3.9 mmol/L (ref 3.5–5.1)
Sodium: 135 mmol/L (ref 135–145)
Total Bilirubin: 0.5 mg/dL (ref 0.3–1.2)
Total Protein: 8 g/dL (ref 6.5–8.1)

## 2022-02-19 LAB — LIPASE, BLOOD: Lipase: 28 U/L (ref 11–51)

## 2022-02-19 NOTE — ED Triage Notes (Signed)
Pt comes into the ED via [facility] from home c/o nausea/vomiting/headache.  PT was found by police today with cocaine in her possession and then she informed the police that she didn't feel well.  Pt's son and daughter in law were found today in her home having Overdosed, the son survived but the daughter in law did not.  Pt now c/o headache, N/V.   ? ?176 CBG ?68 HR ?99% RA ?156/88 ?18 RR ?

## 2022-02-19 NOTE — ED Triage Notes (Signed)
Pt states she is here for a HA, nausea, and generally not feeling well- pt states she has thrown up twice- pt states she has taken her BP meds but feels like her bp is elevated- pt denies abd pain ?

## 2022-02-19 NOTE — ED Provider Notes (Signed)
? ?[facility] ?Provider Note ? ? ? Event Date/Time  ? First MD Initiated Contact with Patient 02/19/22 1621   ?  (approximate) ? ? ?History  ? ?Headache ? ? ?HPI ? ?Connie Hernandez is a 56 y.o. female who presents to the emergency department today with primary concern for headache.  She states it started this morning.  Started at roughly the time her son and daughter-in-law overdosed.  She states that her daughter-in-law died.  Has been fairly constant throughout the day.  She thought it might be related to the fact that her blood pressure was also high.  Patient also had complaint of some nausea and 2 episodes of vomiting.  The patient states she did take her blood pressure medication.  Patient states she was not sick yesterday. ? ?Physical Exam  ? ?Triage Vital Signs: ?ED Triage Vitals  ?Enc Vitals Group  ?   BP 02/19/22 1544 (!) 144/70  ?   Pulse Rate 02/19/22 1544 (!) 54  ?   Resp 02/19/22 1544 (!) 22  ?   Temp 02/19/22 1544 97.7 ?F (36.5 ?C)  ?   Temp src --   ?   SpO2 02/19/22 1544 95 %  ?   Weight 02/19/22 1545 168 lb (76.2 kg)  ?   Height 02/19/22 1545 5\' 1"  (1.549 m)  ?   Head Circumference --   ?   Peak Flow --   ?   Pain Score 02/19/22 1544 7  ? ?Most recent vital signs: ?Vitals:  ? 02/19/22 1544  ?BP: (!) 144/70  ?Pulse: (!) 54  ?Resp: (!) 22  ?Temp: 97.7 ?F (36.5 ?C)  ?SpO2: 95%  ? ? ?General: Awake, no distress.  ?CV:  Good peripheral perfusion. Regular rate. ?Resp:  Normal effort. Clear to auscultation ?Abd:  No distention. Non tender.  ? ?ED Results / Procedures / Treatments  ? ?Labs ?(all labs ordered are listed, but only abnormal results are displayed) ?Labs Reviewed  ?COMPREHENSIVE METABOLIC PANEL - Abnormal; Notable for the following components:  ?    Result Value  ? Glucose, Bld 121 (*)   ? All other components within normal limits  ?LIPASE, BLOOD  ?CBC  ?URINALYSIS, ROUTINE W REFLEX MICROSCOPIC  ?POC URINE PREG, ED  ? ? ? ?EKG ? ?I05/09/23, attending physician,  personally viewed and interpreted this EKG ? ?EKG Time: 1725 ?Rate: 72 ?Rhythm: sinus rhythm ?Axis: normal ?Intervals: qtc 451 ?QRS: narrow, q waves v1, v2 ?ST changes: no st elevation ?Impression: abnormal ekg ? ?Q waves present on ekg dated 04/21/2019.  ? ?RADIOLOGY ?None ? ? ?PROCEDURES: ? ?Critical Care performed: No ? ?Procedures ? ? ?MEDICATIONS ORDERED IN ED: ?Medications - No data to display ? ? ?IMPRESSION / MDM / ASSESSMENT AND PLAN / ED COURSE  ?I reviewed the triage vital signs and the nursing notes. ?             ?               ? ?Differential diagnosis includes, but is not limited to, tension headache, anxiety, intracranial process, dehydration. ? ?Patient presented to the emergency department today with primary concern for headache.  Has been present throughout the day.  Patient did think it might be related to her blood pressure.  Blood pressure is minimally elevated here in the emergency department.  Notably she did lose her daughter-in-law and her son also was in the hospital after an overdose earlier today roughly when  the symptoms started.  Did discuss with patient that could be somewhat related to the anxiety of that episode.  Blood work here without any concerning leukocytosis, electrolyte abnormality.  EKG without any significant change from previous.  I did offer medication to help with patient's symptoms however she declined.  This time given reassuring work-up I do not feel patient requires inpatient admission. ? ?FINAL CLINICAL IMPRESSION(S) / ED DIAGNOSES  ? ?Final diagnoses:  ?Bad headache  ?Nausea and vomiting, unspecified vomiting type  ? ? ?Note:  This document was prepared using Dragon voice recognition software and may include unintentional dictation errors. ? ?  ?Phineas Semen, MD ?02/19/22 1739 ? ?

## 2022-02-19 NOTE — ED Notes (Signed)
Pt reports a h/a with n/v today . Pt on bp meds and took today.  No otc meds today for h/a  pt alert  speech clear.  ?

## 2022-02-19 NOTE — Discharge Instructions (Signed)
Please seek medical attention for any high fevers, chest pain, shortness of breath, change in behavior, persistent vomiting, bloody stool or any other new or concerning symptoms.  

## 2022-02-19 NOTE — ED Notes (Signed)
Pt unable to void at this time. 

## 2022-05-01 ENCOUNTER — Other Ambulatory Visit: Payer: Self-pay | Admitting: Family Medicine

## 2022-05-01 DIAGNOSIS — Z1231 Encounter for screening mammogram for malignant neoplasm of breast: Secondary | ICD-10-CM

## 2022-05-05 ENCOUNTER — Emergency Department: Payer: Medicaid Other

## 2022-05-05 ENCOUNTER — Emergency Department
Admission: EM | Admit: 2022-05-05 | Discharge: 2022-05-05 | Disposition: A | Discharge status: Home / Self Care | Payer: Medicaid Other | Attending: Emergency Medicine | Admitting: Emergency Medicine

## 2022-05-05 ENCOUNTER — Other Ambulatory Visit: Payer: Self-pay

## 2022-05-05 DIAGNOSIS — R0602 Shortness of breath: Secondary | ICD-10-CM | POA: Diagnosis present

## 2022-05-05 DIAGNOSIS — Z20822 Contact with and (suspected) exposure to covid-19: Secondary | ICD-10-CM | POA: Diagnosis not present

## 2022-05-05 DIAGNOSIS — E119 Type 2 diabetes mellitus without complications: Secondary | ICD-10-CM | POA: Diagnosis not present

## 2022-05-05 DIAGNOSIS — J441 Chronic obstructive pulmonary disease with (acute) exacerbation: Secondary | ICD-10-CM | POA: Diagnosis not present

## 2022-05-05 DIAGNOSIS — I10 Essential (primary) hypertension: Secondary | ICD-10-CM | POA: Diagnosis not present

## 2022-05-05 DIAGNOSIS — R197 Diarrhea, unspecified: Secondary | ICD-10-CM | POA: Diagnosis not present

## 2022-05-05 LAB — RESP PANEL BY RT-PCR (FLU A&B, COVID) ARPGX2
Influenza A by PCR: NEGATIVE
Influenza B by PCR: NEGATIVE
SARS Coronavirus 2 by RT PCR: NEGATIVE

## 2022-05-05 MED ORDER — PREDNISONE 20 MG PO TABS: 60.0000 mg | ORAL_TABLET | Freq: Every day | ORAL | 0 refills | Status: AC

## 2022-05-05 MED ORDER — IPRATROPIUM-ALBUTEROL 0.5-2.5 (3) MG/3ML IN SOLN
9.0000 mL | Freq: Once | RESPIRATORY_TRACT | Status: AC
  Administered 2022-05-05: 9 mL via RESPIRATORY_TRACT
  Filled 2022-05-05: qty 3

## 2022-05-05 MED ORDER — ALBUTEROL SULFATE HFA 108 (90 BASE) MCG/ACT IN AERS: 2.0000 | INHALATION_SPRAY | Freq: Four times a day (QID) | RESPIRATORY_TRACT | 0 refills | Status: AC | PRN

## 2022-05-05 MED ORDER — LOPERAMIDE HCL 2 MG PO CAPS
2.0000 mg | ORAL_CAPSULE | Freq: Once | ORAL | Status: AC
  Administered 2022-05-05: 2 mg via ORAL
  Filled 2022-05-05: qty 1

## 2022-05-05 MED ORDER — PREDNISONE 20 MG PO TABS
60.0000 mg | ORAL_TABLET | Freq: Once | ORAL | Status: AC
  Administered 2022-05-05: 60 mg via ORAL
  Filled 2022-05-05: qty 3

## 2022-05-05 NOTE — ED Notes (Signed)
Patient taken to imaging. 

## 2022-05-05 NOTE — ED Triage Notes (Signed)
Pt c/o cough with congestion, fever, chills , body aches for the past 2 days. The pt is in NAD, ambulatory on arrival with a steady gait

## 2022-05-05 NOTE — ED Provider Notes (Signed)
[facility] Provider Note    Event Date/Time   First MD Initiated Contact with Patient 05/05/22 1001     (approximate)   History   Chief Complaint URI   HPI  Connie Hernandez is a 56 y.o. female with past medical history of hypertension, hyperlipidemia, diabetes, and COPD who presents to the ED complaining of cough.  Patient reports that for the past 3 days she has been dealing with a persistent dry cough and has become feeling increasingly short of breath.  She reports feeling feverish with chills, but has not taken her temperature at home.  She describes a feeling of tightness in her chest and has been using her inhaler regularly without significant relief.  Symptoms are associated with some diarrhea but she denies any abdominal pain, nausea, or vomiting.  She is not aware of any sick contacts.     Physical Exam   Triage Vital Signs: ED Triage Vitals [05/05/22 0938]  Enc Vitals Group     BP      Pulse      Resp      Temp      Temp src      SpO2      Weight 163 lb (73.9 kg)     Height 5\' 1"  (1.549 m)     Head Circumference      Peak Flow      Pain Score 7     Pain Loc      Pain Edu?      Excl. in GC?     Most recent vital signs: Vitals:   05/05/22 1037  BP: (!) 138/93  Pulse: 68  Resp: 16  Temp: 98.1 F (36.7 C)  SpO2: 100%    Constitutional: Alert and oriented. Eyes: Conjunctivae are normal. Head: Atraumatic. Nose: No congestion/rhinnorhea. Mouth/Throat: Mucous membranes are moist.  Cardiovascular: Normal rate, regular rhythm. Grossly normal heart sounds.  2+ radial pulses bilaterally. Respiratory: Normal respiratory effort.  No retractions. Lungs with expiratory wheezing throughout. Gastrointestinal: Soft and nontender. No distention. Musculoskeletal: No lower extremity tenderness nor edema.  Neurologic:  Normal speech and language. No gross focal neurologic deficits are appreciated.    ED Results / Procedures / Treatments    Labs (all labs ordered are listed, but only abnormal results are displayed) Labs Reviewed  RESP PANEL BY RT-PCR (FLU A&B, COVID) [id number]     EKG  ED ECG REPORT I, 05/07/22, the attending physician, personally viewed and interpreted this ECG.   Date: 05/05/2022  EKG Time: 10:42  Rate: 73  Rhythm: normal sinus rhythm  Axis: Normal  Intervals:none  ST&T Change: None  RADIOLOGY Chest x-ray reviewed and interpreted by me with no infiltrate, edema, or effusion.  PROCEDURES:  Critical Care performed: No  Procedures   MEDICATIONS ORDERED IN ED: Medications  ipratropium-albuterol (DUONEB) 0.5-2.5 (3) MG/3ML nebulizer solution 9 mL (9 mLs Nebulization Given 05/05/22 1034)  predniSONE (DELTASONE) tablet 60 mg (60 mg Oral Given 05/05/22 1031)  loperamide (IMODIUM) capsule 2 mg (2 mg Oral Given 05/05/22 1039)     IMPRESSION / MDM / ASSESSMENT AND PLAN / ED COURSE  I reviewed the triage vital signs and the nursing notes.                              56 y.o. female with past medical history of hypertension, hyperlipidemia, diabetes, and COPD who presents to the ED with increasing  cough and difficulty breathing with chest tightness over the past 3 days.  Differential diagnosis includes, but is not limited to, pneumonia, URI, COVID-19, influenza, COPD exacerbation, ACS, PE.  Patient well-appearing and in no acute distress, vital signs are unremarkable and she is not in any respiratory distress, maintaining oxygen saturations on room air.  She does have significant wheezing on my examination, we will check chest x-ray for evidence of pneumonia, but if this is unremarkable I suspect she has a viral illness contributing to a COPD exacerbation.  We will treat with DuoNebs as well as dose of prednisone and reassess for improvement in wheezing.  Plan to screen EKG but doubt ACS given atypical symptoms, low suspicion for PE given COPD much more likely.  Chest x-ray is unremarkable,  wheezing is improved on reassessment and patient reports chest tightness and difficulty in breathing are both improving.  Testing for COVID-19 and influenza is negative, suspect other viral etiology causing COPD exacerbation.  Patient is appropriate for discharge home with PCP follow-up, will be prescribed a course of steroids along with albuterol.  She was counseled to return to the ED for new worsening symptoms, patient agrees with plan.      FINAL CLINICAL IMPRESSION(S) / ED DIAGNOSES   Final diagnoses:  COPD exacerbation (HCC)     Rx / DC Orders   ED Discharge Orders          Ordered    albuterol (VENTOLIN HFA) 108 (90 Base) MCG/ACT inhaler  Every 6 hours PRN       Note to Pharmacy: Please supply with spacer   05/05/22 1120    predniSONE (DELTASONE) 20 MG tablet  Daily with breakfast        05/05/22 1120             Note:  This document was prepared using Dragon voice recognition software and may include unintentional dictation errors.   Chesley Noon, MD 05/05/22 1122

## 2022-08-02 IMAGING — CR DG HIP (WITH OR WITHOUT PELVIS) 2-3V*R*
1 series · 3 of 3 positions shown · non-contrast
Comparison: CT abdomen pelvis dated June 30, 2021.

CLINICAL DATA: Chronic right hip pain.

EXAM:
DG HIP (WITH OR WITHOUT PELVIS) 2-3V RIGHT

[Series 1: dg hip unilat w or w/o pelvis 2-3 views  · non-contrast · 0.14mm/px · 3 of 3 slices shown]
[im 1/3]
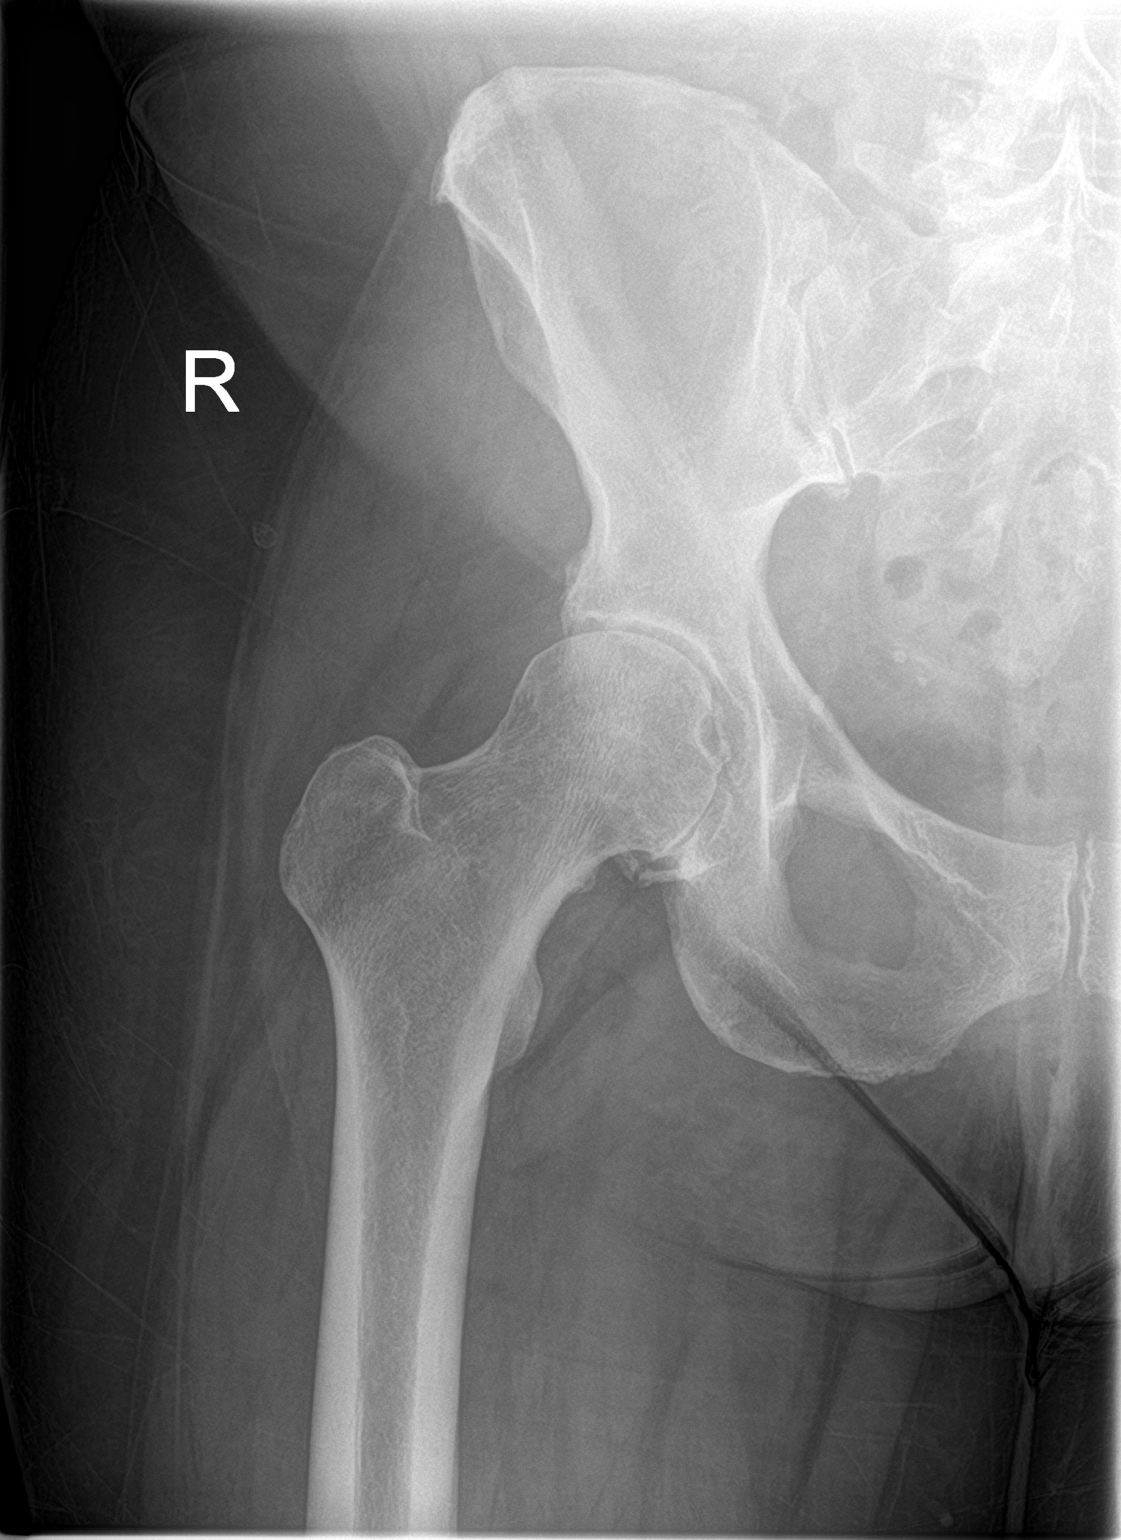
[im 2/3]
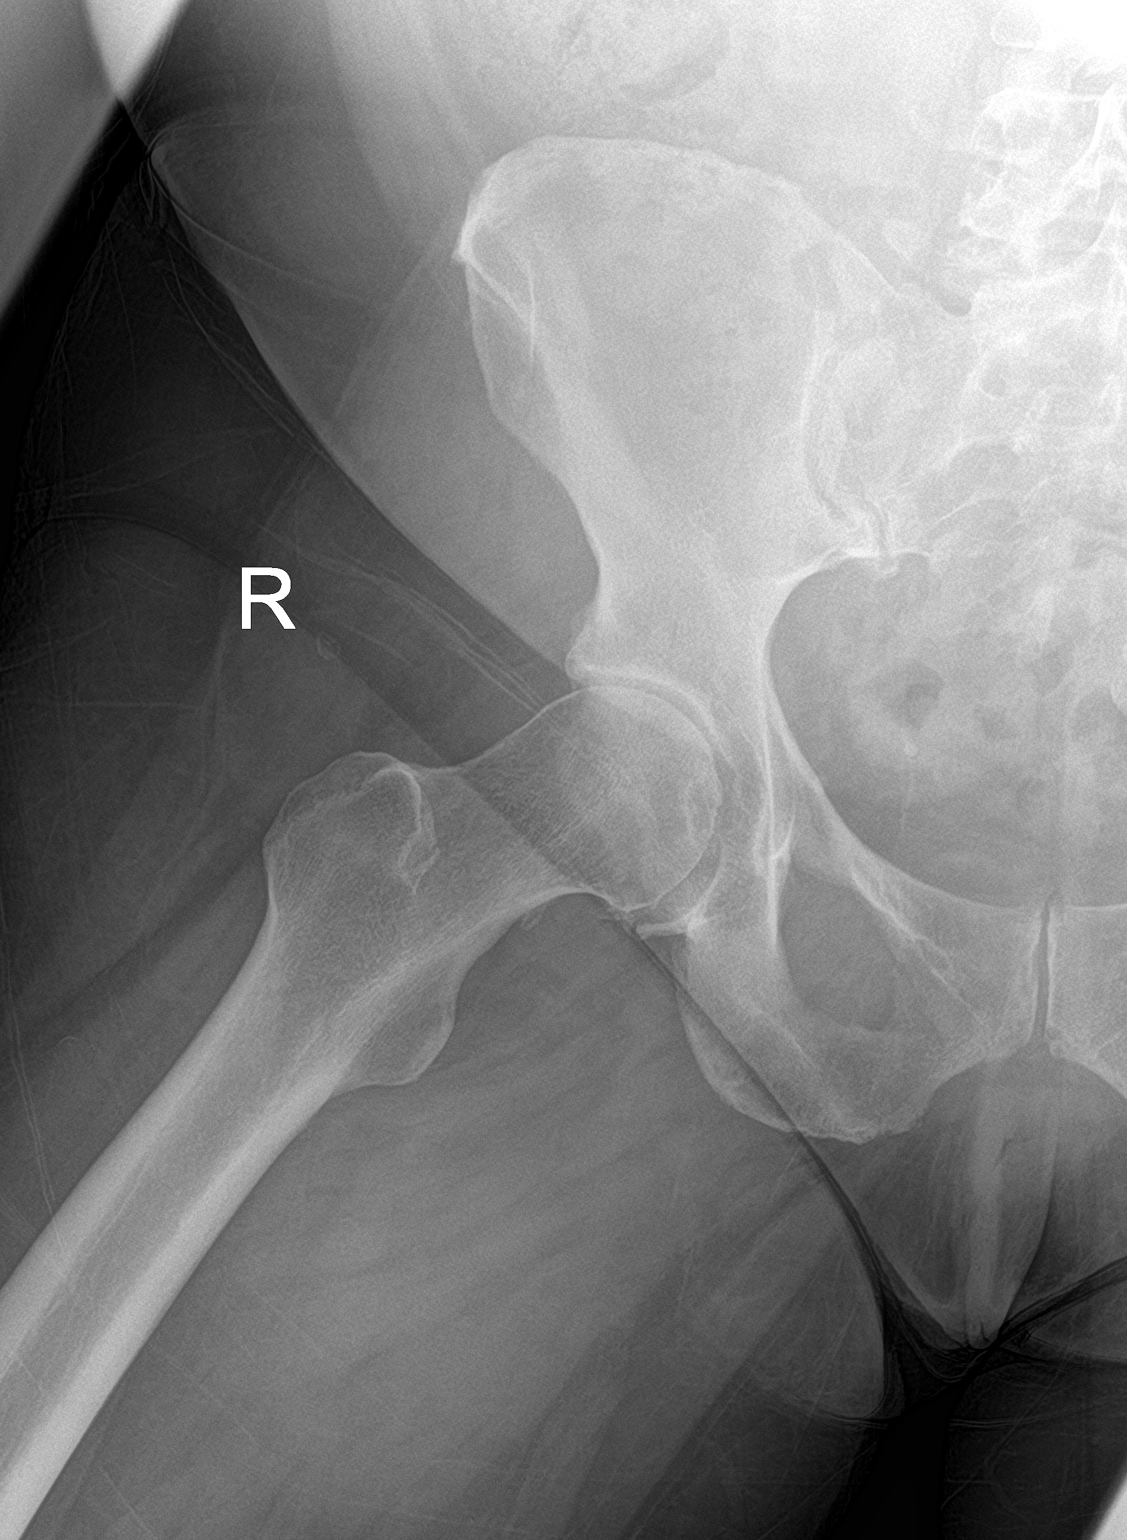
[im 3/3]
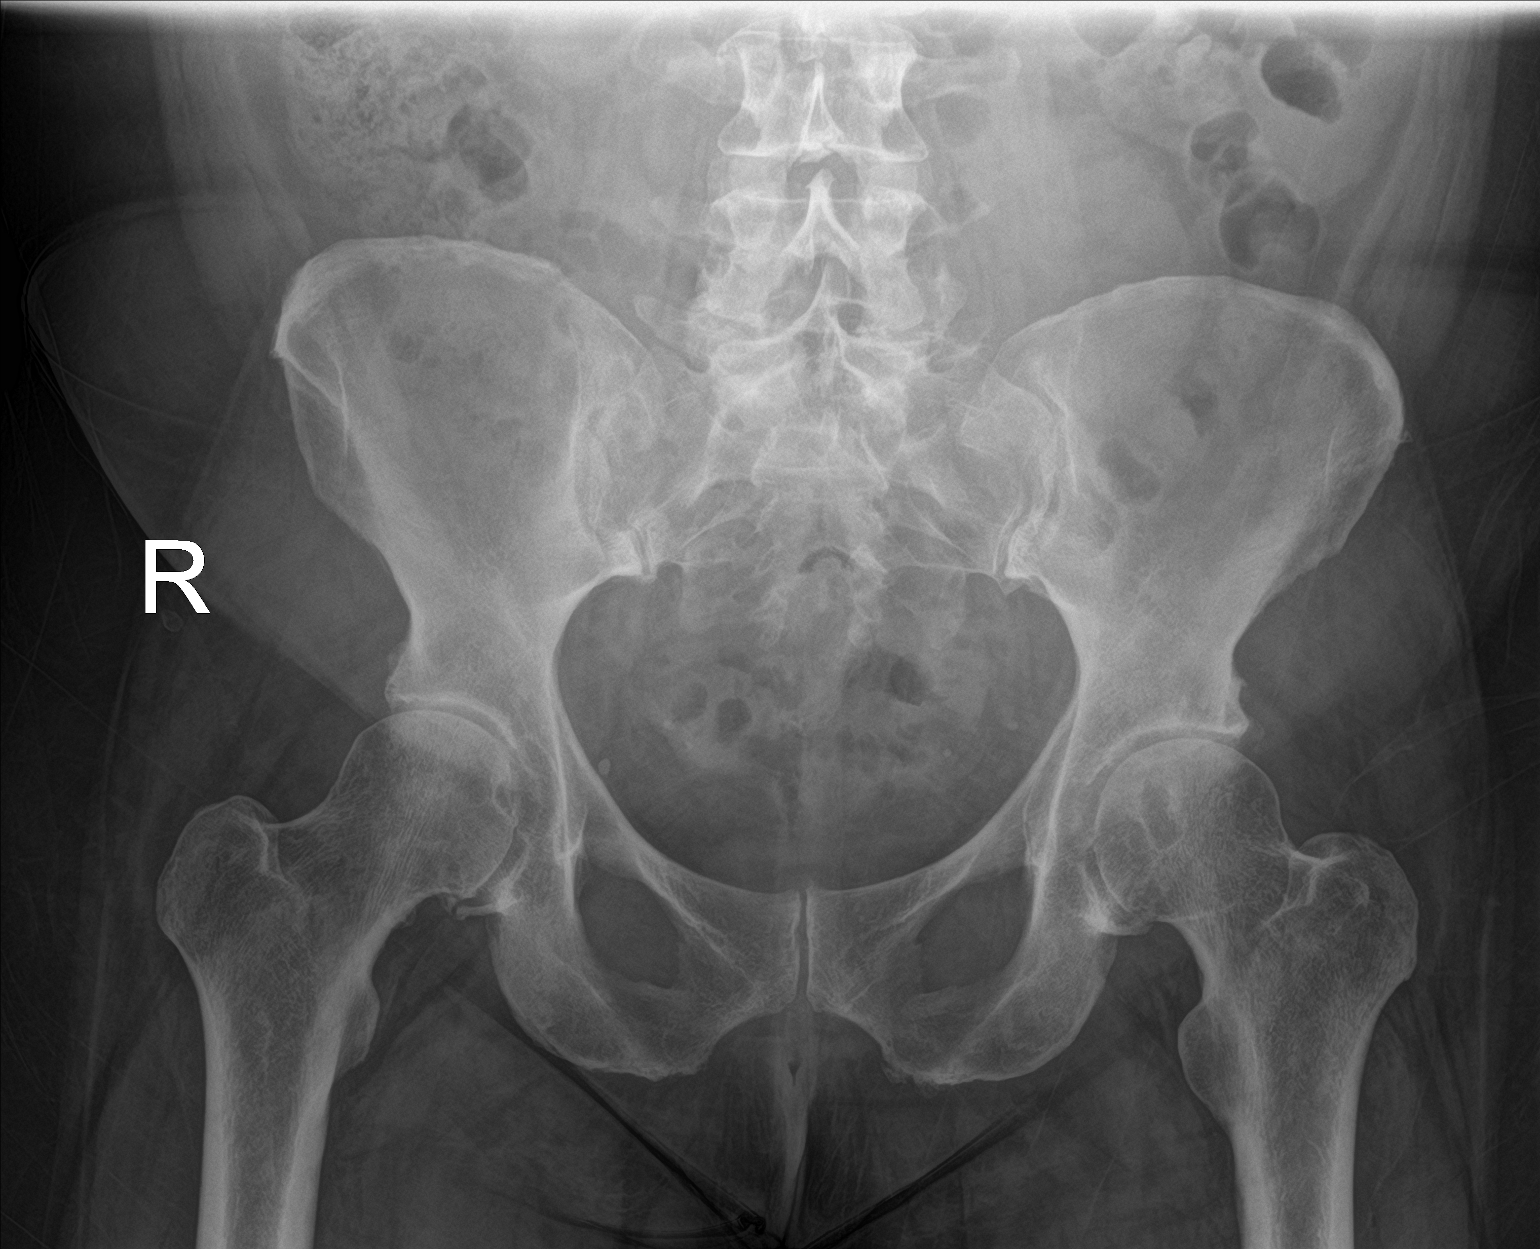

[3 of 3 positions shown; findings below may reference images not displayed]

FINDINGS: No acute fracture or dislocation. Unchanged mild bilateral hip joint
space narrowing with small marginal osteophytes. Unchanged
subchondral cyst in the left femoral head. Bone mineralization is
normal. Soft tissues are unremarkable.
IMPRESSION: 1. Unchanged mild bilateral hip osteoarthritis.

## 2022-10-18 IMAGING — CR DG CHEST 2V
2 series · 2 of 2 positions shown · non-contrast
Comparison: 04/20/2019 chest radiograph.

CLINICAL DATA: Cough, fever, chills

EXAM:
CHEST - 2 VIEW

[chest pa]
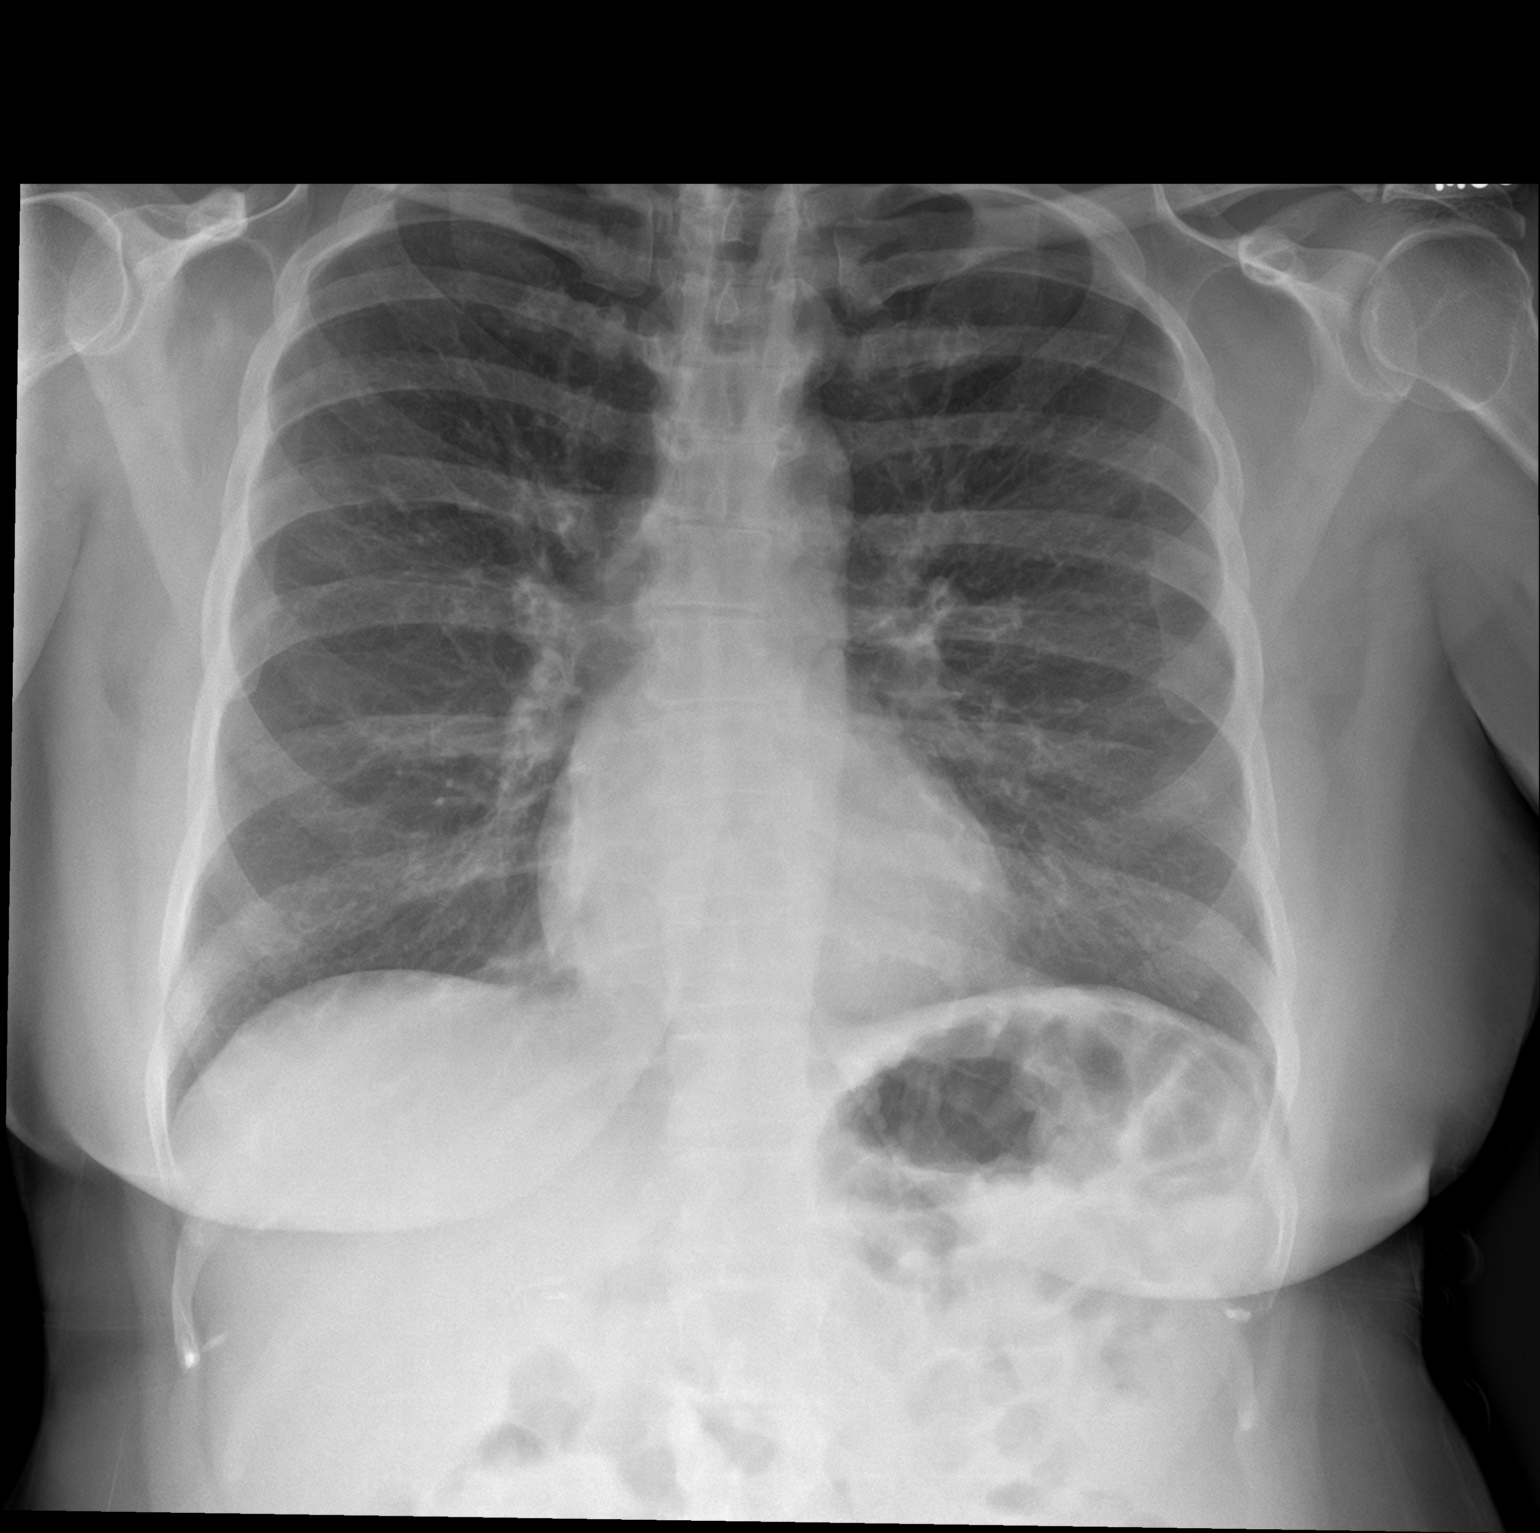

[chest lat]
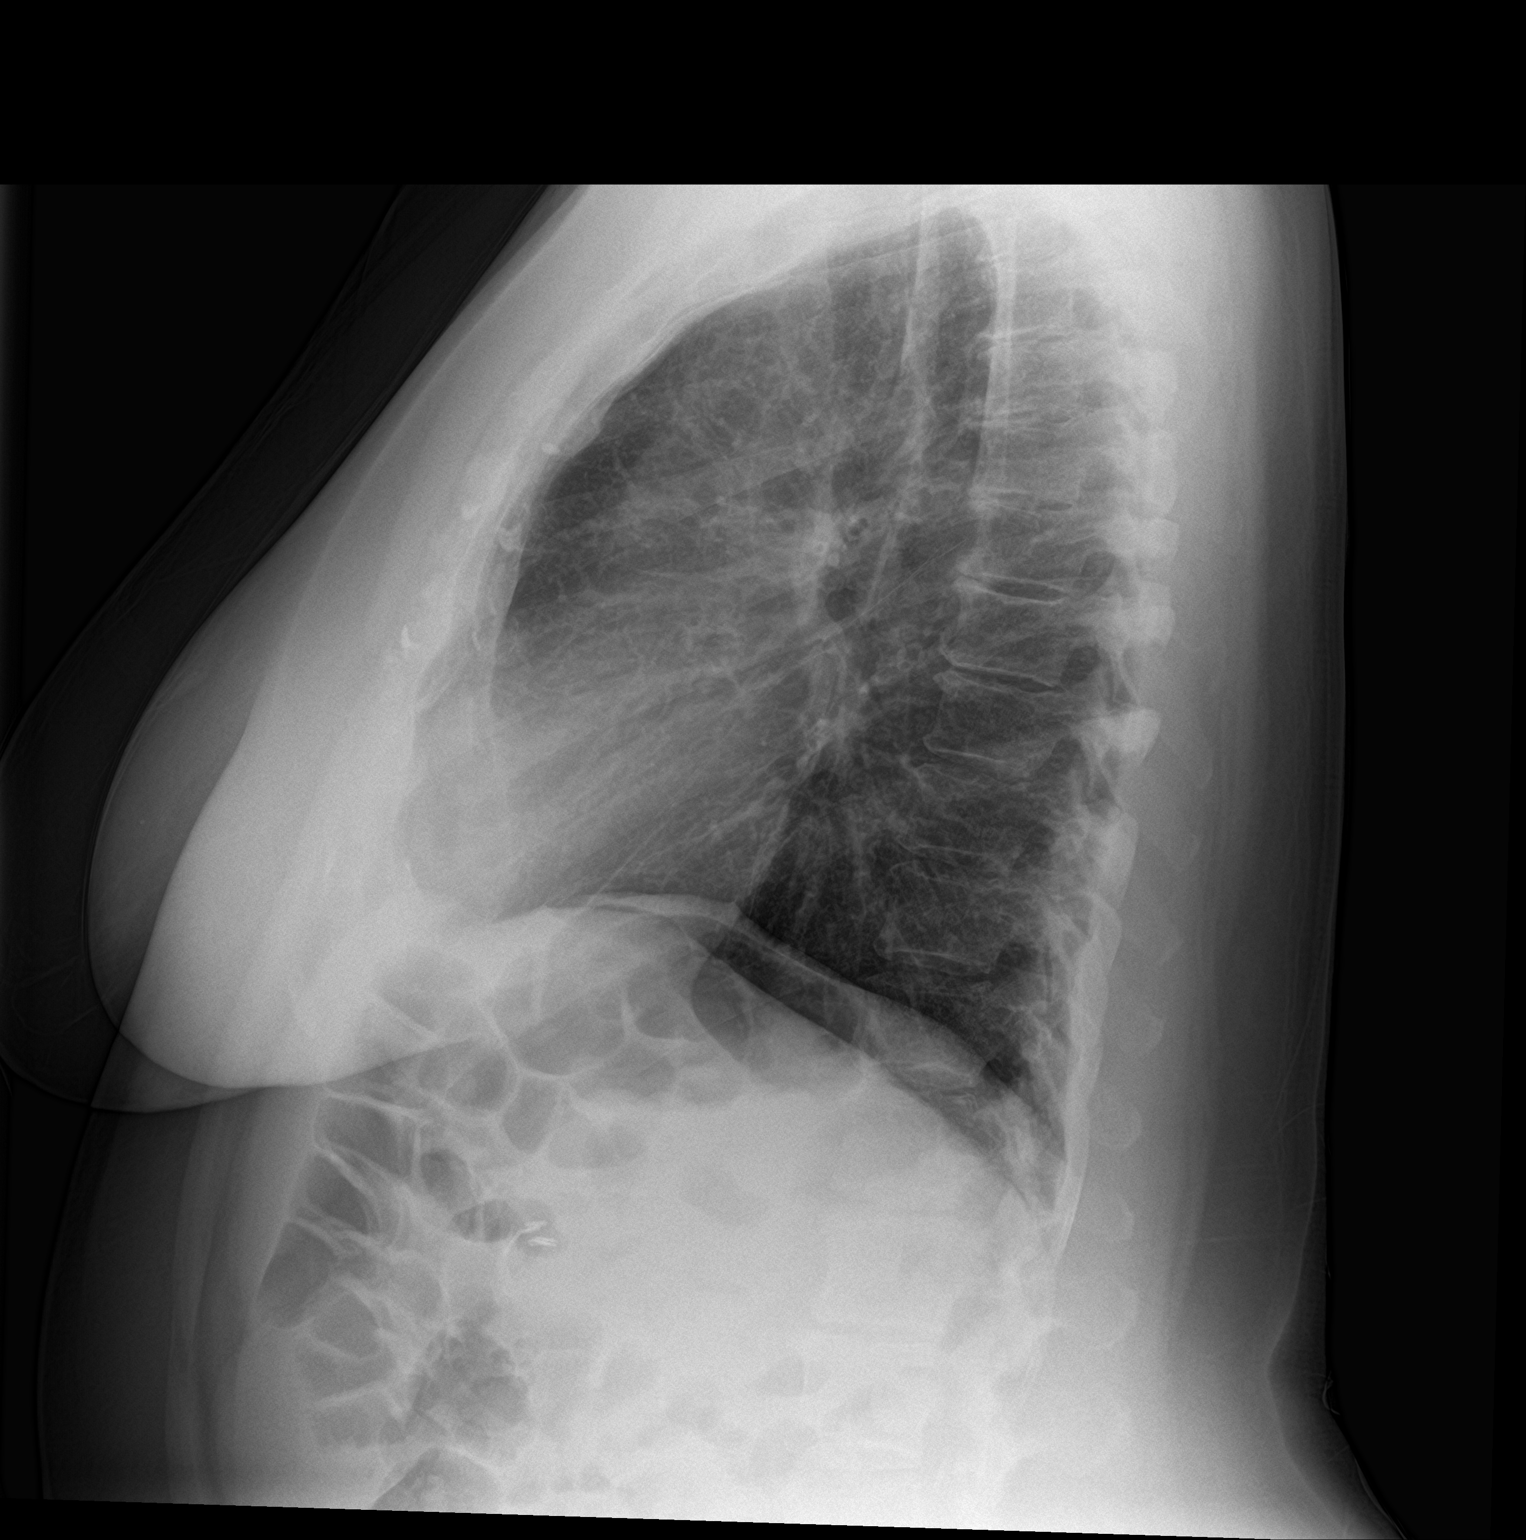

[2 of 2 positions shown; findings below may reference images not displayed]

FINDINGS: Partially visualized surgical hardware from ACDF. Stable
cardiomediastinal silhouette with normal heart size. No
pneumothorax. No pleural effusion. Lungs appear clear, with no acute
consolidative airspace disease and no pulmonary edema. Surgical
clips are seen in the right upper quadrant of the abdomen.
IMPRESSION: No active cardiopulmonary disease.
